# Patient Record
Sex: Female | Born: 1960 | Race: White | Hispanic: No
Health system: Southern US, Community
[De-identification: ages and names within clinical notes are randomized; demographics above are authoritative.]

## PROBLEM LIST (undated history)

## (undated) DIAGNOSIS — Z930 Tracheostomy status: Secondary | ICD-10-CM

## (undated) DIAGNOSIS — J9621 Acute and chronic respiratory failure with hypoxia: Secondary | ICD-10-CM

## (undated) DIAGNOSIS — J1282 Pneumonia due to coronavirus disease 2019: Secondary | ICD-10-CM

## (undated) DIAGNOSIS — J80 Acute respiratory distress syndrome: Secondary | ICD-10-CM

## (undated) DIAGNOSIS — U071 COVID-19: Secondary | ICD-10-CM

---

## 2020-06-15 ENCOUNTER — Institutional Professional Consult (permissible substitution)
Admission: RE | Admit: 2020-06-15 | Discharge: 2020-08-14 | Disposition: A | Discharge status: Rehabilitation Facility | Payer: Medicare (Managed Care) | Attending: Internal Medicine | Admitting: Internal Medicine

## 2020-06-15 ENCOUNTER — Other Ambulatory Visit (HOSPITAL_COMMUNITY): Payer: Medicare (Managed Care)

## 2020-06-15 DIAGNOSIS — R609 Edema, unspecified: Secondary | ICD-10-CM

## 2020-06-15 DIAGNOSIS — D729 Disorder of white blood cells, unspecified: Secondary | ICD-10-CM

## 2020-06-15 DIAGNOSIS — J80 Acute respiratory distress syndrome: Secondary | ICD-10-CM | POA: Diagnosis present

## 2020-06-15 DIAGNOSIS — R509 Fever, unspecified: Secondary | ICD-10-CM

## 2020-06-15 DIAGNOSIS — Z9911 Dependence on respirator [ventilator] status: Secondary | ICD-10-CM

## 2020-06-15 DIAGNOSIS — R0602 Shortness of breath: Secondary | ICD-10-CM

## 2020-06-15 DIAGNOSIS — J1282 Pneumonia due to coronavirus disease 2019: Secondary | ICD-10-CM | POA: Diagnosis present

## 2020-06-15 DIAGNOSIS — U071 COVID-19: Secondary | ICD-10-CM | POA: Diagnosis present

## 2020-06-15 DIAGNOSIS — Z931 Gastrostomy status: Secondary | ICD-10-CM

## 2020-06-15 DIAGNOSIS — J9690 Respiratory failure, unspecified, unspecified whether with hypoxia or hypercapnia: Secondary | ICD-10-CM

## 2020-06-15 DIAGNOSIS — J189 Pneumonia, unspecified organism: Secondary | ICD-10-CM

## 2020-06-15 DIAGNOSIS — Z930 Tracheostomy status: Secondary | ICD-10-CM

## 2020-06-15 DIAGNOSIS — J9621 Acute and chronic respiratory failure with hypoxia: Secondary | ICD-10-CM | POA: Diagnosis present

## 2020-06-15 HISTORY — DX: Pneumonia due to coronavirus disease 2019: J12.82

## 2020-06-15 HISTORY — DX: Acute respiratory distress syndrome: J80

## 2020-06-15 HISTORY — DX: COVID-19: U07.1

## 2020-06-15 HISTORY — DX: Tracheostomy status: Z93.0

## 2020-06-15 HISTORY — DX: Acute and chronic respiratory failure with hypoxia: J96.21

## 2020-06-15 MED ORDER — IOHEXOL 300 MG/ML  SOLN
50.0000 mL | Freq: Once | INTRAMUSCULAR | Status: AC | PRN
  Administered 2020-06-15: 50 mL

## 2020-06-16 ENCOUNTER — Other Ambulatory Visit (HOSPITAL_COMMUNITY): Payer: Medicare (Managed Care)

## 2020-06-16 DIAGNOSIS — J9621 Acute and chronic respiratory failure with hypoxia: Secondary | ICD-10-CM | POA: Diagnosis not present

## 2020-06-16 DIAGNOSIS — Z930 Tracheostomy status: Secondary | ICD-10-CM | POA: Diagnosis not present

## 2020-06-16 DIAGNOSIS — U071 COVID-19: Secondary | ICD-10-CM | POA: Diagnosis not present

## 2020-06-16 DIAGNOSIS — J1282 Pneumonia due to coronavirus disease 2019: Secondary | ICD-10-CM

## 2020-06-16 DIAGNOSIS — Z9911 Dependence on respirator [ventilator] status: Secondary | ICD-10-CM

## 2020-06-16 DIAGNOSIS — J80 Acute respiratory distress syndrome: Secondary | ICD-10-CM

## 2020-06-16 LAB — COMPREHENSIVE METABOLIC PANEL
ALT: 82 U/L — ABNORMAL HIGH (ref 0–44)
AST: 25 U/L (ref 15–41)
Albumin: 2.7 g/dL — ABNORMAL LOW (ref 3.5–5.0)
Alkaline Phosphatase: 147 U/L — ABNORMAL HIGH (ref 38–126)
Anion gap: 12 (ref 5–15)
BUN: 31 mg/dL — ABNORMAL HIGH (ref 8–23)
CO2: 26 mmol/L (ref 22–32)
Calcium: 9.1 mg/dL (ref 8.9–10.3)
Chloride: 107 mmol/L (ref 98–111)
Creatinine, Ser: 0.79 mg/dL (ref 0.44–1.00)
GFR, Estimated: >60 mL/min (ref >60)
Glucose, Bld: 79 mg/dL (ref 70–99)
Potassium: 3.3 mmol/L — ABNORMAL LOW (ref 3.5–5.1)
Sodium: 145 mmol/L (ref 135–145)
Total Bilirubin: 0.5 mg/dL (ref 0.3–1.2)
Total Protein: 6.2 g/dL — ABNORMAL LOW (ref 6.5–8.1)

## 2020-06-16 LAB — URINALYSIS, ROUTINE W REFLEX MICROSCOPIC
Bilirubin Urine: NEGATIVE
Glucose, UA: NEGATIVE mg/dL
Hgb urine dipstick: NEGATIVE
Ketones, ur: NEGATIVE mg/dL
Leukocytes,Ua: NEGATIVE
Nitrite: NEGATIVE
Protein, ur: NEGATIVE mg/dL
Specific Gravity, Urine: 1.016 (ref 1.005–1.030)
pH: 6 (ref 5.0–8.0)

## 2020-06-16 LAB — CBC
HCT: 32.3 % — ABNORMAL LOW (ref 36.0–46.0)
Hemoglobin: 10 g/dL — ABNORMAL LOW (ref 12.0–15.0)
MCH: 33 pg (ref 26.0–34.0)
MCHC: 31 g/dL (ref 30.0–36.0)
MCV: 106.6 fL — ABNORMAL HIGH (ref 80.0–100.0)
Platelets: 322 10*3/uL (ref 150–400)
RBC: 3.03 MIL/uL — ABNORMAL LOW (ref 3.87–5.11)
RDW: 12.8 % (ref 11.5–15.5)
WBC: 8.1 10*3/uL (ref 4.0–10.5)
nRBC: 0 % (ref 0.0–0.2)

## 2020-06-16 LAB — BLOOD GAS, ARTERIAL
Acid-Base Excess: 3.4 mmol/L — ABNORMAL HIGH (ref 0.0–2.0)
Bicarbonate: 27.6 mmol/L (ref 20.0–28.0)
FIO2: 60
O2 Saturation: 97.8 %
Patient temperature: 36.9
pCO2 arterial: 42.7 mmHg (ref 32.0–48.0)
pH, Arterial: 7.426 (ref 7.350–7.450)
pO2, Arterial: 102 mmHg (ref 83.0–108.0)

## 2020-06-16 LAB — PROTIME-INR
INR: 1.1 (ref 0.8–1.2)
Prothrombin Time: 13.3 seconds (ref 11.4–15.2)

## 2020-06-16 NOTE — Consult Note (Signed)
Pulmonary Critical Care Medicine [facility] [place]  PULMONARY SERVICE  Date of Service: 06/16/2020  PULMONARY CRITICAL CARE Hind Chesler  SWH:[id number]  DOB: 28-Dec-1960   DOA: 06/15/2020  Referring Physician: Carron Curie, MD  HPI: Emma Anderson is a 60 y.o. female seen for follow up of Acute on Chronic Respiratory Failure.  Patient has multiple medical problems including coronary artery disease hypertension reflux came into the hospital because of increasing shortness of breath was found to be tachycardic and apparently also had a positive Covid.  Patient had not been vaccinated prior.  Patient was treated initially with BiPAP did not do well and ended up having to be intubated.  Patient also received Decadron and baricitinib.  Patient also did receive treatment for bacterial pneumonia empirically.  CT angio negative for pulmonary embolism.  Her hospital course was complicated by drop in blood pressure and mottling of lower extremities which was felt to be secondary to vasospasm.  Patient underwent tracheostomy for failure to wean.  She did subsequently have necrotic fourth and fifth toes felt to be due to vascular phenomenon subsequently not able to come off the ventilator transferred to our facility for further management and weaning.  Review of Systems:  ROS performed and is unremarkable other than noted above.  Past medical history: GERD Hypertension Coronary artery disease Covid pneumonia  Past surgical history: Tracheostomy PEG  Social history: Unknown tobacco alcohol drug abuse  Family history: Noncontributory to present illness  Allergies: Depakote   Medications: Reviewed on Rounds  Physical Exam:  Vitals: Temperature is 98.0 pulse 83 respiratory 26 blood pressure is 159/82 saturations 98%  Ventilator Settings on pressure assist control FiO2 50% IP 18 PEEP 8  . General: Comfortable at this time . Eyes: Grossly normal lids, irises &  conjunctiva . ENT: grossly tongue is normal . Neck: no obvious mass . Cardiovascular: S1-S2 normal no gallop or rub . Respiratory: No rhonchi and no rales . Abdomen: Soft and nontender . Skin: no rash seen on limited exam . Musculoskeletal: not rigid . Psychiatric:unable to assess . Neurologic: no seizure no involuntary movements         Labs on Admission:  Basic Metabolic Panel: Recent Labs  Lab 06/16/20 0550  NA 145  K 3.3*  CL 107  CO2 26  GLUCOSE 79  BUN 31*  CREATININE 0.79  CALCIUM 9.1    Recent Labs  Lab 06/16/20 0557  PHART 7.426  PCO2ART 42.7  PO2ART 102  HCO3 27.6  O2SAT 97.8    Liver Function Tests: Recent Labs  Lab 06/16/20 0550  AST 25  ALT 82*  ALKPHOS 147*  BILITOT 0.5  PROT 6.2*  ALBUMIN 2.7*   No results for input(s): LIPASE, AMYLASE in the last 168 hours. No results for input(s): AMMONIA in the last 168 hours.  CBC: Recent Labs  Lab 06/16/20 0550  WBC 8.1  HGB 10.0*  HCT 32.3*  MCV 106.6*  PLT 322    Cardiac Enzymes: No results for input(s): CKTOTAL, CKMB, CKMBINDEX, TROPONINI in the last 168 hours.  BNP (last 3 results) No results for input(s): BNP in the last 8760 hours.  ProBNP (last 3 results) No results for input(s): PROBNP in the last 8760 hours.   Radiological Exams on Admission: DG ABDOMEN PEG TUBE LOCATION  Result Date: 06/15/2020 CLINICAL DATA:  Check gastrostomy catheter placement EXAM: ABDOMEN - 1 VIEW COMPARISON:  None. FINDINGS: Gastrostomy catheter is noted within the stomach. Injected contrast flows freely into  the gastric lumen. Nonobstructive bowel gas pattern is noted. No free air is seen. IMPRESSION: Gastrostomy catheter within the stomach. Electronically Signed   By: Alcide Clever M.D.   On: 06/15/2020 21:57    Assessment/Plan Active Problems:   Acute on chronic respiratory failure with hypoxia (HCC)   COVID-19 virus infection   Tracheostomy status (HCC)   Pneumonia due to COVID-19 virus   Acute  respiratory distress syndrome (ARDS) due to COVID-19 virus (HCC)   1. Acute on chronic respiratory failure with hypoxia Reitnauer patient is on full support and pressure assist control mode on 50% FiO2 with an IP of 18 currently the PEEP is 8 plan is going to be to wean the FiO2 with PEEP 2. COVID-19 virus infection recovery plan is to continue with supportive care 3. Tracheostomy status we will be working slowly towards advancement and weaning. 4. COVID-19 pneumonia patient has been also treated empirically with antibiotics. 5. ARDS physiology we will continue with ventilation accordingly  I have personally seen and evaluated the patient, evaluated laboratory and imaging results, formulated the assessment and plan and placed orders. The Patient requires high complexity decision making with multiple systems involvement.  Case was discussed on Rounds with the Respiratory Therapy Director and the Respiratory staff Time Spent  Yevonne Pax, MD [facility] Pulmonary Critical Care Medicine Sleep Medicine

## 2020-06-17 DIAGNOSIS — Z930 Tracheostomy status: Secondary | ICD-10-CM | POA: Diagnosis not present

## 2020-06-17 DIAGNOSIS — U071 COVID-19: Secondary | ICD-10-CM | POA: Diagnosis not present

## 2020-06-17 DIAGNOSIS — Z9911 Dependence on respirator [ventilator] status: Secondary | ICD-10-CM | POA: Diagnosis not present

## 2020-06-17 DIAGNOSIS — J9621 Acute and chronic respiratory failure with hypoxia: Secondary | ICD-10-CM | POA: Diagnosis not present

## 2020-06-17 LAB — URINE CULTURE

## 2020-06-17 LAB — BASIC METABOLIC PANEL
Anion gap: 10 (ref 5–15)
BUN: 25 mg/dL — ABNORMAL HIGH (ref 8–23)
CO2: 28 mmol/L (ref 22–32)
Calcium: 9 mg/dL (ref 8.9–10.3)
Chloride: 100 mmol/L (ref 98–111)
Creatinine, Ser: 0.77 mg/dL (ref 0.44–1.00)
GFR, Estimated: >60 mL/min (ref >60)
Glucose, Bld: 254 mg/dL — ABNORMAL HIGH (ref 70–99)
Potassium: 4.8 mmol/L (ref 3.5–5.1)
Sodium: 138 mmol/L (ref 135–145)

## 2020-06-17 LAB — CBC
HCT: 28.7 % — ABNORMAL LOW (ref 36.0–46.0)
Hemoglobin: 9.3 g/dL — ABNORMAL LOW (ref 12.0–15.0)
MCH: 34.1 pg — ABNORMAL HIGH (ref 26.0–34.0)
MCHC: 32.4 g/dL (ref 30.0–36.0)
MCV: 105.1 fL — ABNORMAL HIGH (ref 80.0–100.0)
Platelets: 281 10*3/uL (ref 150–400)
RBC: 2.73 MIL/uL — ABNORMAL LOW (ref 3.87–5.11)
RDW: 12.6 % (ref 11.5–15.5)
WBC: 7.2 10*3/uL (ref 4.0–10.5)
nRBC: 0 % (ref 0.0–0.2)

## 2020-06-17 LAB — HEMOGLOBIN A1C
Hgb A1c MFr Bld: 7.3 % — ABNORMAL HIGH (ref 4.8–5.6)
Mean Plasma Glucose: 162.81 mg/dL

## 2020-06-17 LAB — MAGNESIUM: Magnesium: 2 mg/dL (ref 1.7–2.4)

## 2020-06-17 LAB — TSH: TSH: 1.208 u[IU]/mL (ref 0.350–4.500)

## 2020-06-17 LAB — PHOSPHORUS: Phosphorus: 3.7 mg/dL (ref 2.5–4.6)

## 2020-06-17 LAB — T4, FREE: Free T4: 0.77 ng/dL (ref 0.61–1.12)

## 2020-06-17 NOTE — Progress Notes (Signed)
Pulmonary Critical Care Medicine [facility] [place]   PULMONARY CRITICAL CARE SERVICE  PROGRESS NOTE  Date of Service: 06/17/2020  Taressa Rauh  XFG:[id number]  DOB: 1961-03-12   DOA: 06/15/2020  Referring Physician: Carron Curie, MD  HPI: Jayde Mcallister is a 60 y.o. female seen for follow up of Acute on Chronic Respiratory Failure.  Patient currently is on pressure support has been on 40% FiO2 with a pressure of 12/5  Medications: Reviewed on Rounds  Physical Exam:  Vitals: Temperature is 96.7 pulse 79 respiratory rate 23 blood pressure is 122/73 saturations 93%  Ventilator Settings on pressure support FiO2 40% pressure of 12/5  . General: Comfortable at this time . Eyes: Grossly normal lids, irises & conjunctiva . ENT: grossly tongue is normal . Neck: no obvious mass . Cardiovascular: S1 S2 normal no gallop . Respiratory: No rhonchi very coarse breath sounds . Abdomen: soft . Skin: no rash seen on limited exam . Musculoskeletal: not rigid . Psychiatric:unable to assess . Neurologic: no seizure no involuntary movements         Lab Data:   Basic Metabolic Panel: Recent Labs  Lab 06/16/20 0550 06/17/20 0428  NA 145 138  K 3.3* 4.8  CL 107 100  CO2 26 28  GLUCOSE 79 254*  BUN 31* 25*  CREATININE 0.79 0.77  CALCIUM 9.1 9.0  MG  --  2.0  PHOS  --  3.7    ABG: Recent Labs  Lab 06/16/20 0557  PHART 7.426  PCO2ART 42.7  PO2ART 102  HCO3 27.6  O2SAT 97.8    Liver Function Tests: Recent Labs  Lab 06/16/20 0550  AST 25  ALT 82*  ALKPHOS 147*  BILITOT 0.5  PROT 6.2*  ALBUMIN 2.7*   No results for input(s): LIPASE, AMYLASE in the last 168 hours. No results for input(s): AMMONIA in the last 168 hours.  CBC: Recent Labs  Lab 06/16/20 0550 06/17/20 0428  WBC 8.1 7.2  HGB 10.0* 9.3*  HCT 32.3* 28.7*  MCV 106.6* 105.1*  PLT 322 281    Cardiac Enzymes: No results for input(s): CKTOTAL, CKMB, CKMBINDEX, TROPONINI in the last 168  hours.  BNP (last 3 results) No results for input(s): BNP in the last 8760 hours.  ProBNP (last 3 results) No results for input(s): PROBNP in the last 8760 hours.  Radiological Exams: DG ABDOMEN PEG TUBE LOCATION  Result Date: 06/15/2020 CLINICAL DATA:  Check gastrostomy catheter placement EXAM: ABDOMEN - 1 VIEW COMPARISON:  None. FINDINGS: Gastrostomy catheter is noted within the stomach. Injected contrast flows freely into the gastric lumen. Nonobstructive bowel gas pattern is noted. No free air is seen. IMPRESSION: Gastrostomy catheter within the stomach. Electronically Signed   By: Alcide Clever M.D.   On: 06/15/2020 21:57   DG CHEST PORT 1 VIEW  Result Date: 06/16/2020 CLINICAL DATA:  Respiratory failure. EXAM: PORTABLE CHEST 1 VIEW COMPARISON:  None. FINDINGS: Monitoring leads overlie the patient. Patient is rotated to the left. Suspect tracheostomy tube overlying the thoracic inlet. Cardiac contours upper limits of normal. Diffuse bilateral airspace opacities, left greater than right. Possible small left pleural effusion. No pneumothorax. IMPRESSION: Diffuse bilateral airspace opacities, left greater than right, may represent multifocal infection in the appropriate clinical setting, atypical infection and/or edema. As no priors are available for comparison, a portion may represent chronic underlying process. Electronically Signed   By: Annia Belt M.D.   On: 06/16/2020 11:45    Assessment/Plan Active Problems:   Acute on  chronic respiratory failure with hypoxia (HCC)   COVID-19 virus infection   Tracheostomy status (HCC)   Pneumonia due to COVID-19 virus   Acute respiratory distress syndrome (ARDS) due to COVID-19 virus (HCC)   1. Acute on chronic respiratory failure with hypoxia patient is weaning on protocol today's goal will be up to 4 hours.  We will continue to advance as tolerated 2. COVID-19 virus infection recovery phase we will continue to follow 3. Tracheostomy remains in  place at this time 4. Pneumonia due to COVID-19 slow improvement 5. ARDS physiology we will continue with supportive care and mechanical ventilation accordingly   I have personally seen and evaluated the patient, evaluated laboratory and imaging results, formulated the assessment and plan and placed orders. The Patient requires high complexity decision making with multiple systems involvement.  Rounds were done with the Respiratory Therapy Director and Staff therapists and discussed with nursing staff also.  Yevonne Pax, MD [facility] Pulmonary Critical Care Medicine Sleep Medicine

## 2020-06-18 DIAGNOSIS — J9621 Acute and chronic respiratory failure with hypoxia: Secondary | ICD-10-CM | POA: Diagnosis not present

## 2020-06-18 DIAGNOSIS — Z930 Tracheostomy status: Secondary | ICD-10-CM | POA: Diagnosis not present

## 2020-06-18 DIAGNOSIS — Z9911 Dependence on respirator [ventilator] status: Secondary | ICD-10-CM | POA: Diagnosis not present

## 2020-06-18 DIAGNOSIS — U071 COVID-19: Secondary | ICD-10-CM | POA: Diagnosis not present

## 2020-06-18 LAB — CULTURE, RESPIRATORY W GRAM STAIN

## 2020-06-18 LAB — CBC
HCT: 26.8 % — ABNORMAL LOW (ref 36.0–46.0)
Hemoglobin: 8.6 g/dL — ABNORMAL LOW (ref 12.0–15.0)
MCH: 33.6 pg (ref 26.0–34.0)
MCHC: 32.1 g/dL (ref 30.0–36.0)
MCV: 104.7 fL — ABNORMAL HIGH (ref 80.0–100.0)
Platelets: 262 10*3/uL (ref 150–400)
RBC: 2.56 MIL/uL — ABNORMAL LOW (ref 3.87–5.11)
RDW: 12.4 % (ref 11.5–15.5)
WBC: 6.5 10*3/uL (ref 4.0–10.5)
nRBC: 0 % (ref 0.0–0.2)

## 2020-06-18 LAB — BASIC METABOLIC PANEL
Anion gap: 12 (ref 5–15)
BUN: 29 mg/dL — ABNORMAL HIGH (ref 8–23)
CO2: 29 mmol/L (ref 22–32)
Calcium: 9.1 mg/dL (ref 8.9–10.3)
Chloride: 96 mmol/L — ABNORMAL LOW (ref 98–111)
Creatinine, Ser: 0.81 mg/dL (ref 0.44–1.00)
GFR, Estimated: >60 mL/min (ref >60)
Glucose, Bld: 229 mg/dL — ABNORMAL HIGH (ref 70–99)
Potassium: 4.5 mmol/L (ref 3.5–5.1)
Sodium: 137 mmol/L (ref 135–145)

## 2020-06-18 LAB — MAGNESIUM: Magnesium: 1.9 mg/dL (ref 1.7–2.4)

## 2020-06-18 NOTE — Progress Notes (Signed)
Pulmonary Critical Care Medicine [facility] [place]   PULMONARY CRITICAL CARE SERVICE  PROGRESS NOTE  Date of Service: 06/18/2020  Shaneta Cervenka  HEN:[id number]  DOB: [birth year]   DOA: 06/15/2020  Referring Physician: Carron Curie, MD  HPI: Emma Anderson is a 60 y.o. female seen for follow up of Acute on Chronic Respiratory Failure.  Patient currently is on pressure support has been on 45% FiO2 the goal is for 4-hour wean on pressure support 12/5  Medications: Reviewed on Rounds  Physical Exam:  Vitals: Temperature is 96.6 pulse 70 respiratory rate 20 blood pressure is 107/70 saturations 96%  Ventilator Settings on pressure support FiO2 45% pressure of 12/5  . General: Comfortable at this time . Eyes: Grossly normal lids, irises & conjunctiva . ENT: grossly tongue is normal . Neck: no obvious mass . Cardiovascular: S1 S2 normal no gallop . Respiratory: Scattered rhonchi very coarse breath sounds . Abdomen: soft . Skin: no rash seen on limited exam . Musculoskeletal: not rigid . Psychiatric:unable to assess . Neurologic: no seizure no involuntary movements         Lab Data:   Basic Metabolic Panel: Recent Labs  Lab 06/16/20 0550 06/17/20 0428  NA 145 138  K 3.3* 4.8  CL 107 100  CO2 26 28  GLUCOSE 79 254*  BUN 31* 25*  CREATININE 0.79 0.77  CALCIUM 9.1 9.0  MG  --  2.0  PHOS  --  3.7    ABG: Recent Labs  Lab 06/16/20 0557  PHART 7.426  PCO2ART 42.7  PO2ART 102  HCO3 27.6  O2SAT 97.8    Liver Function Tests: Recent Labs  Lab 06/16/20 0550  AST 25  ALT 82*  ALKPHOS 147*  BILITOT 0.5  PROT 6.2*  ALBUMIN 2.7*   No results for input(s): LIPASE, AMYLASE in the last 168 hours. No results for input(s): AMMONIA in the last 168 hours.  CBC: Recent Labs  Lab 06/16/20 0550 06/17/20 0428  WBC 8.1 7.2  HGB 10.0* 9.3*  HCT 32.3* 28.7*  MCV 106.6* 105.1*  PLT 322 281    Cardiac Enzymes: No results for input(s): CKTOTAL, CKMB,  CKMBINDEX, TROPONINI in the last 168 hours.  BNP (last 3 results) No results for input(s): BNP in the last 8760 hours.  ProBNP (last 3 results) No results for input(s): PROBNP in the last 8760 hours.  Radiological Exams: DG CHEST PORT 1 VIEW  Result Date: 06/16/2020 CLINICAL DATA:  Respiratory failure. EXAM: PORTABLE CHEST 1 VIEW COMPARISON:  None. FINDINGS: Monitoring leads overlie the patient. Patient is rotated to the left. Suspect tracheostomy tube overlying the thoracic inlet. Cardiac contours upper limits of normal. Diffuse bilateral airspace opacities, left greater than right. Possible small left pleural effusion. No pneumothorax. IMPRESSION: Diffuse bilateral airspace opacities, left greater than right, may represent multifocal infection in the appropriate clinical setting, atypical infection and/or edema. As no priors are available for comparison, a portion may represent chronic underlying process. Electronically Signed   By: Annia Belt M.D.   On: 06/16/2020 11:45    Assessment/Plan Active Problems:   Acute on chronic respiratory failure with hypoxia (HCC)   COVID-19 virus infection   Tracheostomy status (HCC)   Pneumonia due to COVID-19 virus   Acute respiratory distress syndrome (ARDS) due to COVID-19 virus (HCC)   1. Acute on chronic respiratory failure hypoxia we will continue to wean on protocol goal is for 4 hours of pressure support weaning today. 2. COVID-19 virus infection in recovery we  will continue with supportive care. 3. Tracheostomy remains in place 4. Pneumonia due to COVID-19 slow improvement 5. ARDS continue to follow up on chest films   I have personally seen and evaluated the patient, evaluated laboratory and imaging results, formulated the assessment and plan and placed orders. The Patient requires high complexity decision making with multiple systems involvement.  Rounds were done with the Respiratory Therapy Director and Staff therapists and discussed  with nursing staff also.  Yevonne Pax, MD [facility] Pulmonary Critical Care Medicine Sleep Medicine

## 2020-06-19 DIAGNOSIS — Z930 Tracheostomy status: Secondary | ICD-10-CM | POA: Diagnosis not present

## 2020-06-19 DIAGNOSIS — J9621 Acute and chronic respiratory failure with hypoxia: Secondary | ICD-10-CM | POA: Diagnosis not present

## 2020-06-19 DIAGNOSIS — J1282 Pneumonia due to coronavirus disease 2019: Secondary | ICD-10-CM | POA: Diagnosis not present

## 2020-06-19 DIAGNOSIS — U071 COVID-19: Secondary | ICD-10-CM | POA: Diagnosis not present

## 2020-06-19 DIAGNOSIS — J80 Acute respiratory distress syndrome: Secondary | ICD-10-CM | POA: Diagnosis not present

## 2020-06-19 NOTE — Progress Notes (Signed)
Pulmonary Critical Care Medicine [facility] [place]   PULMONARY CRITICAL CARE SERVICE  PROGRESS NOTE  Date of Service: 06/19/2020  Emma Anderson  KXF:[id number]  DOB: [birth year]   DOA: 06/15/2020  Referring Physician: Carron Curie, MD  HPI: Emma Anderson is a 60 y.o. female seen for follow up of Acute on Chronic Respiratory Failure.  Patient currently is on pressure support has been on 40% FiO2 with a pressure of 12/5  Medications: Reviewed on Rounds  Physical Exam:  Vitals: Temperature is 98.1 pulse 76 respiratory 22 blood pressure is 118/75 saturations 98%  Ventilator Settings on pressure support FiO2 40% pressure 12/5  . General: Comfortable at this time . Eyes: Grossly normal lids, irises & conjunctiva . ENT: grossly tongue is normal . Neck: no obvious mass . Cardiovascular: S1 S2 normal no gallop . Respiratory: Scattered rhonchi expansion is equal . Abdomen: soft . Skin: no rash seen on limited exam . Musculoskeletal: not rigid . Psychiatric:unable to assess . Neurologic: no seizure no involuntary movements         Lab Data:   Basic Metabolic Panel: Recent Labs  Lab 06/16/20 0550 06/17/20 0428 06/18/20 0908  NA 145 138 137  K 3.3* 4.8 4.5  CL 107 100 96*  CO2 26 28 29   GLUCOSE 79 254* 229*  BUN 31* 25* 29*  CREATININE 0.79 0.77 0.81  CALCIUM 9.1 9.0 9.1  MG  --  2.0 1.9  PHOS  --  3.7  --     ABG: Recent Labs  Lab 06/16/20 0557  PHART 7.426  PCO2ART 42.7  PO2ART 102  HCO3 27.6  O2SAT 97.8    Liver Function Tests: Recent Labs  Lab 06/16/20 0550  AST 25  ALT 82*  ALKPHOS 147*  BILITOT 0.5  PROT 6.2*  ALBUMIN 2.7*   No results for input(s): LIPASE, AMYLASE in the last 168 hours. No results for input(s): AMMONIA in the last 168 hours.  CBC: Recent Labs  Lab 06/16/20 0550 06/17/20 0428 06/18/20 0908  WBC 8.1 7.2 6.5  HGB 10.0* 9.3* 8.6*  HCT 32.3* 28.7* 26.8*  MCV 106.6* 105.1* 104.7*  PLT 322 281 262     Cardiac Enzymes: No results for input(s): CKTOTAL, CKMB, CKMBINDEX, TROPONINI in the last 168 hours.  BNP (last 3 results) No results for input(s): BNP in the last 8760 hours.  ProBNP (last 3 results) No results for input(s): PROBNP in the last 8760 hours.  Radiological Exams: No results found.  Assessment/Plan Active Problems:   Acute on chronic respiratory failure with hypoxia (HCC)   COVID-19 virus infection   Tracheostomy status (HCC)   Pneumonia due to COVID-19 virus   Acute respiratory distress syndrome (ARDS) due to COVID-19 virus (HCC)   1. Acute on chronic respiratory failure hypoxia plan is to continue with the pressure support 12-hour goal today 2. COVID-19 virus infection recovery 3. Pneumonia due to COVID-19 slow improvement 4. Tracheostomy working towards eventual capping and changing out 5. ARDS physiology we will continue to follow along slow improvement has been noted   I have personally seen and evaluated the patient, evaluated laboratory and imaging results, formulated the assessment and plan and placed orders. The Patient requires high complexity decision making with multiple systems involvement.  Rounds were done with the Respiratory Therapy Director and Staff therapists and discussed with nursing staff also.  06/20/20, MD [facility] Pulmonary Critical Care Medicine Sleep Medicine

## 2020-06-20 ENCOUNTER — Other Ambulatory Visit (HOSPITAL_COMMUNITY): Payer: Medicare (Managed Care)

## 2020-06-20 LAB — URINALYSIS, ROUTINE W REFLEX MICROSCOPIC
Bilirubin Urine: NEGATIVE
Glucose, UA: NEGATIVE mg/dL
Hgb urine dipstick: NEGATIVE
Ketones, ur: NEGATIVE mg/dL
Nitrite: NEGATIVE
Protein, ur: NEGATIVE mg/dL
Specific Gravity, Urine: 1.01 (ref 1.005–1.030)
WBC, UA: >50 WBC/hpf — ABNORMAL HIGH (ref 0–5)
pH: 8 (ref 5.0–8.0)

## 2020-06-21 DIAGNOSIS — J9621 Acute and chronic respiratory failure with hypoxia: Secondary | ICD-10-CM | POA: Diagnosis not present

## 2020-06-21 DIAGNOSIS — J80 Acute respiratory distress syndrome: Secondary | ICD-10-CM | POA: Diagnosis not present

## 2020-06-21 DIAGNOSIS — J1282 Pneumonia due to coronavirus disease 2019: Secondary | ICD-10-CM | POA: Diagnosis not present

## 2020-06-21 DIAGNOSIS — Z930 Tracheostomy status: Secondary | ICD-10-CM | POA: Diagnosis not present

## 2020-06-21 DIAGNOSIS — U071 COVID-19: Secondary | ICD-10-CM | POA: Diagnosis not present

## 2020-06-21 LAB — CBC
HCT: 34.9 % — ABNORMAL LOW (ref 36.0–46.0)
Hemoglobin: 11.1 g/dL — ABNORMAL LOW (ref 12.0–15.0)
MCH: 33.5 pg (ref 26.0–34.0)
MCHC: 31.8 g/dL (ref 30.0–36.0)
MCV: 105.4 fL — ABNORMAL HIGH (ref 80.0–100.0)
Platelets: 288 10*3/uL (ref 150–400)
RBC: 3.31 MIL/uL — ABNORMAL LOW (ref 3.87–5.11)
RDW: 12.7 % (ref 11.5–15.5)
WBC: 8 10*3/uL (ref 4.0–10.5)
nRBC: 0 % (ref 0.0–0.2)

## 2020-06-21 LAB — RENAL FUNCTION PANEL
Albumin: 2.8 g/dL — ABNORMAL LOW (ref 3.5–5.0)
Anion gap: 13 (ref 5–15)
BUN: 42 mg/dL — ABNORMAL HIGH (ref 8–23)
CO2: 28 mmol/L (ref 22–32)
Calcium: 10 mg/dL (ref 8.9–10.3)
Chloride: 95 mmol/L — ABNORMAL LOW (ref 98–111)
Creatinine, Ser: 0.94 mg/dL (ref 0.44–1.00)
GFR, Estimated: >60 mL/min (ref >60)
Glucose, Bld: 147 mg/dL — ABNORMAL HIGH (ref 70–99)
Phosphorus: 5.3 mg/dL — ABNORMAL HIGH (ref 2.5–4.6)
Potassium: 5 mmol/L (ref 3.5–5.1)
Sodium: 136 mmol/L (ref 135–145)

## 2020-06-21 NOTE — Progress Notes (Signed)
Pulmonary Critical Care Medicine [facility] [place]   PULMONARY CRITICAL CARE SERVICE  PROGRESS NOTE  Date of Service: 06/21/2020  Aalyssa Montufar  ZOX:[id number]  DOB: December 02, 1960   DOA: 06/15/2020  Referring Physician: Carron Curie, MD  HPI: Emma Anderson is a 60 y.o. female seen for follow up of Acute on Chronic Respiratory Failure.  Patient is weaning on T collar today for goal of about 2 hours seems to be doing okay  Medications: Reviewed on Rounds  Physical Exam:  Vitals: Temperature 96.4 pulse 67 respiratory 18 blood pressure is 90/54 saturations 100%  Ventilator Settings on T collar off the vent  . General: Comfortable at this time . Eyes: Grossly normal lids, irises & conjunctiva . ENT: grossly tongue is normal . Neck: no obvious mass . Cardiovascular: S1 S2 normal no gallop . Respiratory: Scattered rhonchi expansion is equal . Abdomen: soft . Skin: no rash seen on limited exam . Musculoskeletal: not rigid . Psychiatric:unable to assess . Neurologic: no seizure no involuntary movements         Lab Data:   Basic Metabolic Panel: Recent Labs  Lab 06/16/20 0550 06/17/20 0428 06/18/20 0908 06/21/20 0505  NA 145 138 137 136  K 3.3* 4.8 4.5 5.0  CL 107 100 96* 95*  CO2 26 28 29 28   GLUCOSE 79 254* 229* 147*  BUN 31* 25* 29* 42*  CREATININE 0.79 0.77 0.81 0.94  CALCIUM 9.1 9.0 9.1 10.0  MG  --  2.0 1.9  --   PHOS  --  3.7  --  5.3*    ABG: Recent Labs  Lab 06/16/20 0557  PHART 7.426  PCO2ART 42.7  PO2ART 102  HCO3 27.6  O2SAT 97.8    Liver Function Tests: Recent Labs  Lab 06/16/20 0550 06/21/20 0505  AST 25  --   ALT 82*  --   ALKPHOS 147*  --   BILITOT 0.5  --   PROT 6.2*  --   ALBUMIN 2.7* 2.8*   No results for input(s): LIPASE, AMYLASE in the last 168 hours. No results for input(s): AMMONIA in the last 168 hours.  CBC: Recent Labs  Lab 06/16/20 0550 06/17/20 0428 06/18/20 0908 06/21/20 0816  WBC 8.1 7.2 6.5 8.0   HGB 10.0* 9.3* 8.6* 11.1*  HCT 32.3* 28.7* 26.8* 34.9*  MCV 106.6* 105.1* 104.7* 105.4*  PLT 322 281 262 288    Cardiac Enzymes: No results for input(s): CKTOTAL, CKMB, CKMBINDEX, TROPONINI in the last 168 hours.  BNP (last 3 results) No results for input(s): BNP in the last 8760 hours.  ProBNP (last 3 results) No results for input(s): PROBNP in the last 8760 hours.  Radiological Exams: DG CHEST PORT 1 VIEW  Result Date: 06/20/2020 CLINICAL DATA:  Fever. EXAM: PORTABLE CHEST 1 VIEW COMPARISON:  06/16/2020. FINDINGS: Patient rotated to the left. Tracheostomy tube in stable position. Heart size stable. Low lung volumes. Diffuse bilateral pulmonary infiltrates/edema again noted. Slight improvement in aeration of the left lung noted on today's exam. Possible small left pleural effusion again noted. No pneumothorax. IMPRESSION: Tracheostomy tube in stable position. Low lung volumes. Diffuse bilateral pulmonary infiltrates/edema again noted. Slight improvement in aeration of the left lung noted on today's exam. Possible small left pleural effusion again noted. Electronically Signed   By: Maisie Fus  Register   On: 06/20/2020 15:14    Assessment/Plan Active Problems:   Acute on chronic respiratory failure with hypoxia (HCC)   COVID-19 virus infection   Tracheostomy status (HCC)  Pneumonia due to COVID-19 virus   Acute respiratory distress syndrome (ARDS) due to COVID-19 virus (HCC)   1. Acute on chronic respiratory failure hypoxia doing fairly well the plan is going to be to advance the wean on the T collar as tolerated. 2. Tracheostomy remains in place we will continue to advance weaning 3. COVID-19 virus infection recovery 4. Pneumonia due to COVID-19 treated slow improvement 5. ARDS at baseline with slow improvement chest x-ray still showing some diffuse infiltrates   I have personally seen and evaluated the patient, evaluated laboratory and imaging results, formulated the assessment  and plan and placed orders. The Patient requires high complexity decision making with multiple systems involvement.  Rounds were done with the Respiratory Therapy Director and Staff therapists and discussed with nursing staff also.  Yevonne Pax, MD [facility] Pulmonary Critical Care Medicine Sleep Medicine

## 2020-06-22 ENCOUNTER — Encounter: Payer: Self-pay | Admitting: Internal Medicine

## 2020-06-22 DIAGNOSIS — J1282 Pneumonia due to coronavirus disease 2019: Secondary | ICD-10-CM | POA: Diagnosis not present

## 2020-06-22 DIAGNOSIS — U071 COVID-19: Secondary | ICD-10-CM | POA: Diagnosis present

## 2020-06-22 DIAGNOSIS — Z930 Tracheostomy status: Secondary | ICD-10-CM | POA: Diagnosis not present

## 2020-06-22 DIAGNOSIS — J9621 Acute and chronic respiratory failure with hypoxia: Secondary | ICD-10-CM | POA: Diagnosis present

## 2020-06-22 DIAGNOSIS — J80 Acute respiratory distress syndrome: Secondary | ICD-10-CM | POA: Diagnosis not present

## 2020-06-22 LAB — URINE CULTURE: Culture: >=100000 — AB

## 2020-06-22 LAB — CULTURE, RESPIRATORY W GRAM STAIN

## 2020-06-22 NOTE — Progress Notes (Signed)
Pulmonary Critical Care Medicine [facility] [place]   PULMONARY CRITICAL CARE SERVICE  PROGRESS NOTE  Date of Service: 06/22/2020  Charles Niese  ZOX:[id number]  DOB: Oct 04, 1960   DOA: 06/15/2020  Referring Physician: Carron Curie, MD  HPI: Emma Anderson is a 60 y.o. female seen for follow up of Acute on Chronic Respiratory Failure.  Patient currently is on pressure support has been on 40% FiO2 will try to wean on 12/5  Medications: Reviewed on Rounds  Physical Exam:  Vitals: Temperature is 98.8 pulse 72 respiratory rate is 21 blood pressure is 115/66 saturations 100%  Ventilator Settings on pressure support FiO2 40% pressure 12/5  . General: Comfortable at this time . Eyes: Grossly normal lids, irises & conjunctiva . ENT: grossly tongue is normal . Neck: no obvious mass . Cardiovascular: S1 S2 normal no gallop . Respiratory: Scattered rhonchi expansion is equal . Abdomen: soft . Skin: no rash seen on limited exam . Musculoskeletal: not rigid . Psychiatric:unable to assess . Neurologic: no seizure no involuntary movements         Lab Data:   Basic Metabolic Panel: Recent Labs  Lab 06/16/20 0550 06/17/20 0428 06/18/20 0908 06/21/20 0505  NA 145 138 137 136  K 3.3* 4.8 4.5 5.0  CL 107 100 96* 95*  CO2 26 28 29 28   GLUCOSE 79 254* 229* 147*  BUN 31* 25* 29* 42*  CREATININE 0.79 0.77 0.81 0.94  CALCIUM 9.1 9.0 9.1 10.0  MG  --  2.0 1.9  --   PHOS  --  3.7  --  5.3*    ABG: Recent Labs  Lab 06/16/20 0557  PHART 7.426  PCO2ART 42.7  PO2ART 102  HCO3 27.6  O2SAT 97.8    Liver Function Tests: Recent Labs  Lab 06/16/20 0550 06/21/20 0505  AST 25  --   ALT 82*  --   ALKPHOS 147*  --   BILITOT 0.5  --   PROT 6.2*  --   ALBUMIN 2.7* 2.8*   No results for input(s): LIPASE, AMYLASE in the last 168 hours. No results for input(s): AMMONIA in the last 168 hours.  CBC: Recent Labs  Lab 06/16/20 0550 06/17/20 0428 06/18/20 0908  06/21/20 0816  WBC 8.1 7.2 6.5 8.0  HGB 10.0* 9.3* 8.6* 11.1*  HCT 32.3* 28.7* 26.8* 34.9*  MCV 106.6* 105.1* 104.7* 105.4*  PLT 322 281 262 288    Cardiac Enzymes: No results for input(s): CKTOTAL, CKMB, CKMBINDEX, TROPONINI in the last 168 hours.  BNP (last 3 results) No results for input(s): BNP in the last 8760 hours.  ProBNP (last 3 results) No results for input(s): PROBNP in the last 8760 hours.  Radiological Exams: DG CHEST PORT 1 VIEW  Result Date: 06/20/2020 CLINICAL DATA:  Fever. EXAM: PORTABLE CHEST 1 VIEW COMPARISON:  06/16/2020. FINDINGS: Patient rotated to the left. Tracheostomy tube in stable position. Heart size stable. Low lung volumes. Diffuse bilateral pulmonary infiltrates/edema again noted. Slight improvement in aeration of the left lung noted on today's exam. Possible small left pleural effusion again noted. No pneumothorax. IMPRESSION: Tracheostomy tube in stable position. Low lung volumes. Diffuse bilateral pulmonary infiltrates/edema again noted. Slight improvement in aeration of the left lung noted on today's exam. Possible small left pleural effusion again noted. Electronically Signed   By: 06/18/2020  Register   On: 06/20/2020 15:14    Assessment/Plan Active Problems:   Acute on chronic respiratory failure with hypoxia (HCC)   COVID-19 virus infection  Tracheostomy status (HCC)   Pneumonia due to COVID-19 virus   Acute respiratory distress syndrome (ARDS) due to COVID-19 virus (HCC)   1. Acute on chronic respiratory failure with hypoxia we will continue with on pressure support titrate as tolerated. 2. COVID-19 virus infection recovery 3. Tracheostomy remains in place 4. Pneumonia due to COVID-19 improving 5. ARDS slow improvement we will continue to monitor   I have personally seen and evaluated the patient, evaluated laboratory and imaging results, formulated the assessment and plan and placed orders. The Patient requires high complexity decision  making with multiple systems involvement.  Rounds were done with the Respiratory Therapy Director and Staff therapists and discussed with nursing staff also.  Yevonne Pax, MD [facility] Pulmonary Critical Care Medicine Sleep Medicine

## 2020-06-23 DIAGNOSIS — J80 Acute respiratory distress syndrome: Secondary | ICD-10-CM | POA: Diagnosis not present

## 2020-06-23 DIAGNOSIS — J9621 Acute and chronic respiratory failure with hypoxia: Secondary | ICD-10-CM | POA: Diagnosis not present

## 2020-06-23 DIAGNOSIS — U071 COVID-19: Secondary | ICD-10-CM | POA: Diagnosis not present

## 2020-06-23 DIAGNOSIS — J1282 Pneumonia due to coronavirus disease 2019: Secondary | ICD-10-CM | POA: Diagnosis not present

## 2020-06-23 DIAGNOSIS — Z930 Tracheostomy status: Secondary | ICD-10-CM | POA: Diagnosis not present

## 2020-06-23 LAB — BASIC METABOLIC PANEL
Anion gap: 12 (ref 5–15)
BUN: 44 mg/dL — ABNORMAL HIGH (ref 8–23)
CO2: 28 mmol/L (ref 22–32)
Calcium: 10.1 mg/dL (ref 8.9–10.3)
Chloride: 95 mmol/L — ABNORMAL LOW (ref 98–111)
Creatinine, Ser: 0.84 mg/dL (ref 0.44–1.00)
GFR, Estimated: >60 mL/min (ref >60)
Glucose, Bld: 166 mg/dL — ABNORMAL HIGH (ref 70–99)
Potassium: 4.4 mmol/L (ref 3.5–5.1)
Sodium: 135 mmol/L (ref 135–145)

## 2020-06-23 NOTE — Progress Notes (Signed)
Pulmonary Critical Care Medicine [facility] [place]   PULMONARY CRITICAL CARE SERVICE  PROGRESS NOTE  Date of Service: 06/23/2020  Emma Anderson  YKZ:[id number]  DOB: 03-30-1961   DOA: 06/15/2020  Referring Physician: Carron Curie, MD  HPI: Emma Anderson is a 60 y.o. female seen for follow up of Acute on Chronic Respiratory Failure.  At this time patient is on pressure support has been on 35% FiO2 currently pressure of 12/5  Medications: Reviewed on Rounds  Physical Exam:  Vitals: Temperature is 97.5 pulse 68 respiratory rate 18 blood pressure is 105/62 saturations 99%  Ventilator Settings on pressure support FiO2 35% pressure 12/5  . General: Comfortable at this time . Eyes: Grossly normal lids, irises & conjunctiva . ENT: grossly tongue is normal . Neck: no obvious mass . Cardiovascular: S1 S2 normal no gallop . Respiratory: No rhonchi very coarse breath sounds . Abdomen: soft . Skin: no rash seen on limited exam . Musculoskeletal: not rigid . Psychiatric:unable to assess . Neurologic: no seizure no involuntary movements         Lab Data:   Basic Metabolic Panel: Recent Labs  Lab 06/17/20 0428 06/18/20 0908 06/21/20 0505 06/23/20 0620  NA 138 137 136 135  K 4.8 4.5 5.0 4.4  CL 100 96* 95* 95*  CO2 28 29 28 28   GLUCOSE 254* 229* 147* 166*  BUN 25* 29* 42* 44*  CREATININE 0.77 0.81 0.94 0.84  CALCIUM 9.0 9.1 10.0 10.1  MG 2.0 1.9  --   --   PHOS 3.7  --  5.3*  --     ABG: No results for input(s): PHART, PCO2ART, PO2ART, HCO3, O2SAT in the last 168 hours.  Liver Function Tests: Recent Labs  Lab 06/21/20 0505  ALBUMIN 2.8*   No results for input(s): LIPASE, AMYLASE in the last 168 hours. No results for input(s): AMMONIA in the last 168 hours.  CBC: Recent Labs  Lab 06/17/20 0428 06/18/20 0908 06/21/20 0816  WBC 7.2 6.5 8.0  HGB 9.3* 8.6* 11.1*  HCT 28.7* 26.8* 34.9*  MCV 105.1* 104.7* 105.4*  PLT 281 262 288    Cardiac  Enzymes: No results for input(s): CKTOTAL, CKMB, CKMBINDEX, TROPONINI in the last 168 hours.  BNP (last 3 results) No results for input(s): BNP in the last 8760 hours.  ProBNP (last 3 results) No results for input(s): PROBNP in the last 8760 hours.  Radiological Exams: No results found.  Assessment/Plan Active Problems:   Acute on chronic respiratory failure with hypoxia (HCC)   COVID-19 virus infection   Tracheostomy status (HCC)   Pneumonia due to COVID-19 virus   Acute respiratory distress syndrome (ARDS) due to COVID-19 virus (HCC)   1. Acute on chronic respiratory failure hypoxia we will continue with pressure support wean as tolerated continue pulmonary toilet. 2. COVID-19 virus infection in recovery phase 3. Tracheostomy remains in place we will continue to follow 4. Pneumonia due to COVID-19 at baseline 5. ARDS treated slowly improving   I have personally seen and evaluated the patient, evaluated laboratory and imaging results, formulated the assessment and plan and placed orders. The Patient requires high complexity decision making with multiple systems involvement.  Rounds were done with the Respiratory Therapy Director and Staff therapists and discussed with nursing staff also.  06/23/20, MD [facility] Pulmonary Critical Care Medicine Sleep Medicine

## 2020-06-24 DIAGNOSIS — U071 COVID-19: Secondary | ICD-10-CM | POA: Diagnosis not present

## 2020-06-24 DIAGNOSIS — J80 Acute respiratory distress syndrome: Secondary | ICD-10-CM | POA: Diagnosis not present

## 2020-06-24 DIAGNOSIS — J1282 Pneumonia due to coronavirus disease 2019: Secondary | ICD-10-CM | POA: Diagnosis not present

## 2020-06-24 DIAGNOSIS — Z930 Tracheostomy status: Secondary | ICD-10-CM | POA: Diagnosis not present

## 2020-06-24 DIAGNOSIS — J9621 Acute and chronic respiratory failure with hypoxia: Secondary | ICD-10-CM | POA: Diagnosis not present

## 2020-06-24 NOTE — Progress Notes (Signed)
Pulmonary Critical Care Medicine [facility] [place]   PULMONARY CRITICAL CARE SERVICE  PROGRESS NOTE  Date of Service: 06/24/2020  Emma Anderson  [id number]:[id number]  DOB: 08-Nov-1960   DOA: 06/15/2020  Referring Physician: Carron Curie, MD  HPI: Emma Anderson is a 60 y.o. female seen for follow up of Acute on Chronic Respiratory Failure.  Patient is on pressure support has been on 35% FiO2 should be able to do T collar today  Medications: Reviewed on Rounds  Physical Exam:  Vitals: Temperature is 98.3 pulse 82 respiratory rate is 18 blood pressure is 107/62 saturations 96%  Ventilator Settings on pressure support FiO2 35%  . General: Comfortable at this time . Eyes: Grossly normal lids, irises & conjunctiva . ENT: grossly tongue is normal . Neck: no obvious mass . Cardiovascular: S1 S2 normal no gallop . Respiratory: Scattered rhonchi very coarse breath sounds . Abdomen: soft . Skin: no rash seen on limited exam . Musculoskeletal: not rigid . Psychiatric:unable to assess . Neurologic: no seizure no involuntary movements         Lab Data:   Basic Metabolic Panel: Recent Labs  Lab 06/18/20 0908 06/21/20 0505 06/23/20 0620  NA 137 136 135  K 4.5 5.0 4.4  CL 96* 95* 95*  CO2 29 28 28   GLUCOSE 229* 147* 166*  BUN 29* 42* 44*  CREATININE 0.81 0.94 0.84  CALCIUM 9.1 10.0 10.1  MG 1.9  --   --   PHOS  --  5.3*  --     ABG: No results for input(s): PHART, PCO2ART, PO2ART, HCO3, O2SAT in the last 168 hours.  Liver Function Tests: Recent Labs  Lab 06/21/20 0505  ALBUMIN 2.8*   No results for input(s): LIPASE, AMYLASE in the last 168 hours. No results for input(s): AMMONIA in the last 168 hours.  CBC: Recent Labs  Lab 06/18/20 0908 06/21/20 0816  WBC 6.5 8.0  HGB 8.6* 11.1*  HCT 26.8* 34.9*  MCV 104.7* 105.4*  PLT 262 288    Cardiac Enzymes: No results for input(s): CKTOTAL, CKMB, CKMBINDEX, TROPONINI in the last 168 hours.  BNP (last 3  results) No results for input(s): BNP in the last 8760 hours.  ProBNP (last 3 results) No results for input(s): PROBNP in the last 8760 hours.  Radiological Exams: No results found.  Assessment/Plan Active Problems:   Acute on chronic respiratory failure with hypoxia (HCC)   COVID-19 virus infection   Tracheostomy status (HCC)   Pneumonia due to COVID-19 virus   Acute respiratory distress syndrome (ARDS) due to COVID-19 virus (HCC)   1. Acute on chronic respiratory failure hypoxia plan is to continue with the weaning advanced to T collar today. 2. COVID-19 virus infection in recovery 3. Tracheostomy remains in place 4. Pneumonia due to COVID-19 treated 5. ARDS slow improvement we will continue to follow along   I have personally seen and evaluated the patient, evaluated laboratory and imaging results, formulated the assessment and plan and placed orders. The Patient requires high complexity decision making with multiple systems involvement.  Rounds were done with the Respiratory Therapy Director and Staff therapists and discussed with nursing staff also.  06/23/20, MD [facility] Pulmonary Critical Care Medicine Sleep Medicine

## 2020-06-25 DIAGNOSIS — J1282 Pneumonia due to coronavirus disease 2019: Secondary | ICD-10-CM | POA: Diagnosis not present

## 2020-06-25 DIAGNOSIS — J9621 Acute and chronic respiratory failure with hypoxia: Secondary | ICD-10-CM | POA: Diagnosis not present

## 2020-06-25 DIAGNOSIS — Z930 Tracheostomy status: Secondary | ICD-10-CM | POA: Diagnosis not present

## 2020-06-25 DIAGNOSIS — U071 COVID-19: Secondary | ICD-10-CM | POA: Diagnosis not present

## 2020-06-25 DIAGNOSIS — J80 Acute respiratory distress syndrome: Secondary | ICD-10-CM | POA: Diagnosis not present

## 2020-06-25 LAB — RENAL FUNCTION PANEL
Albumin: 2.8 g/dL — ABNORMAL LOW (ref 3.5–5.0)
Anion gap: 13 (ref 5–15)
BUN: 45 mg/dL — ABNORMAL HIGH (ref 8–23)
CO2: 30 mmol/L (ref 22–32)
Calcium: 9.9 mg/dL (ref 8.9–10.3)
Chloride: 94 mmol/L — ABNORMAL LOW (ref 98–111)
Creatinine, Ser: 0.8 mg/dL (ref 0.44–1.00)
GFR, Estimated: >60 mL/min (ref >60)
Glucose, Bld: 168 mg/dL — ABNORMAL HIGH (ref 70–99)
Phosphorus: 4.2 mg/dL (ref 2.5–4.6)
Potassium: 4.2 mmol/L (ref 3.5–5.1)
Sodium: 137 mmol/L (ref 135–145)

## 2020-06-25 NOTE — Progress Notes (Signed)
Pulmonary Critical Care Medicine [facility] [place]   PULMONARY CRITICAL CARE SERVICE  PROGRESS NOTE  Date of Service: 06/25/2020  Emma Anderson  EXN:[id number]  DOB: 1960/08/29   DOA: 06/15/2020  Referring Physician: Carron Curie, MD  HPI: Emma Anderson is a 60 y.o. female seen for follow up of Acute on Chronic Respiratory Failure.  Patient currently is on the T collar on 35% FiO2 goal is for 24 hours today  Medications: Reviewed on Rounds  Physical Exam:  Vitals: Temperature is 96.1 pulse 70 respiratory rate is 15 blood pressure is 117/66 saturations 97%  Ventilator Settings on T collar with an FiO2 of 35%  . General: Comfortable at this time . Eyes: Grossly normal lids, irises & conjunctiva . ENT: grossly tongue is normal . Neck: no obvious mass . Cardiovascular: S1 S2 normal no gallop . Respiratory: Scattered rhonchi very coarse breath sounds . Abdomen: soft . Skin: no rash seen on limited exam . Musculoskeletal: not rigid . Psychiatric:unable to assess . Neurologic: no seizure no involuntary movements         Lab Data:   Basic Metabolic Panel: Recent Labs  Lab 06/21/20 0505 06/23/20 0620 06/25/20 0439  NA 136 135 137  K 5.0 4.4 4.2  CL 95* 95* 94*  CO2 28 28 30   GLUCOSE 147* 166* 168*  BUN 42* 44* 45*  CREATININE 0.94 0.84 0.80  CALCIUM 10.0 10.1 9.9  PHOS 5.3*  --  4.2    ABG: No results for input(s): PHART, PCO2ART, PO2ART, HCO3, O2SAT in the last 168 hours.  Liver Function Tests: Recent Labs  Lab 06/21/20 0505 06/25/20 0439  ALBUMIN 2.8* 2.8*   No results for input(s): LIPASE, AMYLASE in the last 168 hours. No results for input(s): AMMONIA in the last 168 hours.  CBC: Recent Labs  Lab 06/21/20 0816  WBC 8.0  HGB 11.1*  HCT 34.9*  MCV 105.4*  PLT 288    Cardiac Enzymes: No results for input(s): CKTOTAL, CKMB, CKMBINDEX, TROPONINI in the last 168 hours.  BNP (last 3 results) No results for input(s): BNP in the last  8760 hours.  ProBNP (last 3 results) No results for input(s): PROBNP in the last 8760 hours.  Radiological Exams: No results found.  Assessment/Plan Active Problems:   Acute on chronic respiratory failure with hypoxia (HCC)   COVID-19 virus infection   Tracheostomy status (HCC)   Pneumonia due to COVID-19 virus   Acute respiratory distress syndrome (ARDS) due to COVID-19 virus (HCC)   1. Acute on chronic respiratory failure hypoxia we will continue T collar titrate oxygen continue pulmonary toilet. 2. COVID-19 virus infection recovery 3. Tracheostomy doing well with weaning 4. Pneumonia due to COVID-19 treated slow improvement 5. ARDS improving   I have personally seen and evaluated the patient, evaluated laboratory and imaging results, formulated the assessment and plan and placed orders. The Patient requires high complexity decision making with multiple systems involvement.  Rounds were done with the Respiratory Therapy Director and Staff therapists and discussed with nursing staff also.  06/23/20, MD [facility] Pulmonary Critical Care Medicine Sleep Medicine

## 2020-06-26 DIAGNOSIS — J1282 Pneumonia due to coronavirus disease 2019: Secondary | ICD-10-CM | POA: Diagnosis not present

## 2020-06-26 DIAGNOSIS — U071 COVID-19: Secondary | ICD-10-CM | POA: Diagnosis not present

## 2020-06-26 DIAGNOSIS — J9621 Acute and chronic respiratory failure with hypoxia: Secondary | ICD-10-CM | POA: Diagnosis not present

## 2020-06-26 DIAGNOSIS — J80 Acute respiratory distress syndrome: Secondary | ICD-10-CM | POA: Diagnosis not present

## 2020-06-26 DIAGNOSIS — Z930 Tracheostomy status: Secondary | ICD-10-CM | POA: Diagnosis not present

## 2020-06-26 LAB — CBC
HCT: 26 % — ABNORMAL LOW (ref 36.0–46.0)
Hemoglobin: 8.8 g/dL — ABNORMAL LOW (ref 12.0–15.0)
MCH: 34.5 pg — ABNORMAL HIGH (ref 26.0–34.0)
MCHC: 33.8 g/dL (ref 30.0–36.0)
MCV: 102 fL — ABNORMAL HIGH (ref 80.0–100.0)
Platelets: 181 10*3/uL (ref 150–400)
RBC: 2.55 MIL/uL — ABNORMAL LOW (ref 3.87–5.11)
RDW: 12.9 % (ref 11.5–15.5)
WBC: 3.6 10*3/uL — ABNORMAL LOW (ref 4.0–10.5)
nRBC: 0 % (ref 0.0–0.2)

## 2020-06-26 LAB — PHOSPHORUS: Phosphorus: 3.5 mg/dL (ref 2.5–4.6)

## 2020-06-26 LAB — BASIC METABOLIC PANEL
Anion gap: 11 (ref 5–15)
BUN: 34 mg/dL — ABNORMAL HIGH (ref 8–23)
CO2: 30 mmol/L (ref 22–32)
Calcium: 9.2 mg/dL (ref 8.9–10.3)
Chloride: 97 mmol/L — ABNORMAL LOW (ref 98–111)
Creatinine, Ser: 0.74 mg/dL (ref 0.44–1.00)
GFR, Estimated: >60 mL/min (ref >60)
Glucose, Bld: 122 mg/dL — ABNORMAL HIGH (ref 70–99)
Potassium: 4 mmol/L (ref 3.5–5.1)
Sodium: 138 mmol/L (ref 135–145)

## 2020-06-26 LAB — MAGNESIUM: Magnesium: 2.1 mg/dL (ref 1.7–2.4)

## 2020-06-26 NOTE — Progress Notes (Signed)
Pulmonary Critical Care Medicine [facility] [place]   PULMONARY CRITICAL CARE SERVICE  PROGRESS NOTE  Date of Service: 06/26/2020  Emma Anderson  NGE:[id number]  DOB: May 19, 1961   DOA: 06/15/2020  Referring Physician: Carron Curie, MD  HPI: Emma Anderson is a 60 y.o. female seen for follow up of Acute on Chronic Respiratory Failure.  Patient currently is on T collar has been on 35% FiO2 good saturations  Medications: Reviewed on Rounds  Physical Exam:  Vitals: Temperature is 97.1 pulse 73 respiratory 17 blood pressure is 110/70 saturations 99%  Ventilator Settings off the ventilator on T collar FiO2 35%  . General: Comfortable at this time . Eyes: Grossly normal lids, irises & conjunctiva . ENT: grossly tongue is normal . Neck: no obvious mass . Cardiovascular: S1 S2 normal no gallop . Respiratory: Scattered rhonchi expansion is equal . Abdomen: soft . Skin: no rash seen on limited exam . Musculoskeletal: not rigid . Psychiatric:unable to assess . Neurologic: no seizure no involuntary movements         Lab Data:   Basic Metabolic Panel: Recent Labs  Lab 06/21/20 0505 06/23/20 0620 06/25/20 0439 06/26/20 0422  NA 136 135 137 138  K 5.0 4.4 4.2 4.0  CL 95* 95* 94* 97*  CO2 28 28 30 30   GLUCOSE 147* 166* 168* 122*  BUN 42* 44* 45* 34*  CREATININE 0.94 0.84 0.80 0.74  CALCIUM 10.0 10.1 9.9 9.2  MG  --   --   --  2.1  PHOS 5.3*  --  4.2 3.5    ABG: No results for input(s): PHART, PCO2ART, PO2ART, HCO3, O2SAT in the last 168 hours.  Liver Function Tests: Recent Labs  Lab 06/21/20 0505 06/25/20 0439  ALBUMIN 2.8* 2.8*   No results for input(s): LIPASE, AMYLASE in the last 168 hours. No results for input(s): AMMONIA in the last 168 hours.  CBC: Recent Labs  Lab 06/21/20 0816 06/26/20 0422  WBC 8.0 3.6*  HGB 11.1* 8.8*  HCT 34.9* 26.0*  MCV 105.4* 102.0*  PLT 288 181    Cardiac Enzymes: No results for input(s): CKTOTAL, CKMB,  CKMBINDEX, TROPONINI in the last 168 hours.  BNP (last 3 results) No results for input(s): BNP in the last 8760 hours.  ProBNP (last 3 results) No results for input(s): PROBNP in the last 8760 hours.  Radiological Exams: No results found.  Assessment/Plan Active Problems:   Acute on chronic respiratory failure with hypoxia (HCC)   COVID-19 virus infection   Tracheostomy status (HCC)   Pneumonia due to COVID-19 virus   Acute respiratory distress syndrome (ARDS) due to COVID-19 virus (HCC)   1. Acute on chronic respiratory failure hypoxia we will continue with T-piece today goal is 48 hours 2. COVID-19 virus infection recovery 3. Tracheostomy doing fine so far 4. Pneumonia due to COVID-19 slow improvement 5. ARDS patient still severe disease need to continue to monitor   I have personally seen and evaluated the patient, evaluated laboratory and imaging results, formulated the assessment and plan and placed orders. The Patient requires high complexity decision making with multiple systems involvement.  Rounds were done with the Respiratory Therapy Director and Staff therapists and discussed with nursing staff also.  08/24/20, MD [facility] Pulmonary Critical Care Medicine Sleep Medicine

## 2020-06-27 ENCOUNTER — Other Ambulatory Visit (HOSPITAL_COMMUNITY): Payer: Medicare (Managed Care)

## 2020-06-27 DIAGNOSIS — J1282 Pneumonia due to coronavirus disease 2019: Secondary | ICD-10-CM | POA: Diagnosis not present

## 2020-06-27 DIAGNOSIS — J80 Acute respiratory distress syndrome: Secondary | ICD-10-CM | POA: Diagnosis not present

## 2020-06-27 DIAGNOSIS — U071 COVID-19: Secondary | ICD-10-CM | POA: Diagnosis not present

## 2020-06-27 DIAGNOSIS — Z930 Tracheostomy status: Secondary | ICD-10-CM | POA: Diagnosis not present

## 2020-06-27 DIAGNOSIS — J9621 Acute and chronic respiratory failure with hypoxia: Secondary | ICD-10-CM | POA: Diagnosis not present

## 2020-06-27 NOTE — Progress Notes (Signed)
Pulmonary Critical Care Medicine [facility] [place]   PULMONARY CRITICAL CARE SERVICE  PROGRESS NOTE  Date of Service: 06/27/2020  Emma Anderson  [id number]  DOB: Jun 23, 1960   DOA: 06/15/2020  Referring Physician: Carron Curie, MD  HPI: Emma Anderson is a 60 y.o. female seen for follow up of Acute on Chronic Respiratory Failure.  Patient at this time is off the ventilator on T collar currently on 35% FiO2  Medications: Reviewed on Rounds  Physical Exam:  Vitals: Temperature is 97.2 pulse 94 respiratory 22 blood pressure is 130/68 saturations 95%  Ventilator Settings on T collar FiO2 of 35%  . General: Comfortable at this time . Eyes: Grossly normal lids, irises & conjunctiva . ENT: grossly tongue is normal . Neck: no obvious mass . Cardiovascular: S1 S2 normal no gallop . Respiratory: Scattered rhonchi expansion is equal . Abdomen: soft . Skin: no rash seen on limited exam . Musculoskeletal: not rigid . Psychiatric:unable to assess . Neurologic: no seizure no involuntary movements         Lab Data:   Basic Metabolic Panel: Recent Labs  Lab 06/21/20 0505 06/23/20 0620 06/25/20 0439 06/26/20 0422  NA 136 135 137 138  K 5.0 4.4 4.2 4.0  CL 95* 95* 94* 97*  CO2 28 28 30 30   GLUCOSE 147* 166* 168* 122*  BUN 42* 44* 45* 34*  CREATININE 0.94 0.84 0.80 0.74  CALCIUM 10.0 10.1 9.9 9.2  MG  --   --   --  2.1  PHOS 5.3*  --  4.2 3.5    ABG: No results for input(s): PHART, PCO2ART, PO2ART, HCO3, O2SAT in the last 168 hours.  Liver Function Tests: Recent Labs  Lab 06/21/20 0505 06/25/20 0439  ALBUMIN 2.8* 2.8*   No results for input(s): LIPASE, AMYLASE in the last 168 hours. No results for input(s): AMMONIA in the last 168 hours.  CBC: Recent Labs  Lab 06/21/20 0816 06/26/20 0422  WBC 8.0 3.6*  HGB 11.1* 8.8*  HCT 34.9* 26.0*  MCV 105.4* 102.0*  PLT 288 181    Cardiac Enzymes: No results for input(s): CKTOTAL, CKMB, CKMBINDEX,  TROPONINI in the last 168 hours.  BNP (last 3 results) No results for input(s): BNP in the last 8760 hours.  ProBNP (last 3 results) No results for input(s): PROBNP in the last 8760 hours.  Radiological Exams: No results found.  Assessment/Plan Active Problems:   Acute on chronic respiratory failure with hypoxia (HCC)   COVID-19 virus infection   Tracheostomy status (HCC)   Pneumonia due to COVID-19 virus   Acute respiratory distress syndrome (ARDS) due to COVID-19 virus (HCC)   1. Acute on chronic respiratory failure hypoxia we will continue with T-piece titrate oxygen continue pulmonary toilet 2. COVID-19 virus infection recovery 3. Tracheostomy remains in place 4. Pneumonia due to COVID-19 treated slowly improving 5. ARDS improved   I have personally seen and evaluated the patient, evaluated laboratory and imaging results, formulated the assessment and plan and placed orders. The Patient requires high complexity decision making with multiple systems involvement.  Rounds were done with the Respiratory Therapy Director and Staff therapists and discussed with nursing staff also.  08/24/20, MD [facility] Pulmonary Critical Care Medicine Sleep Medicine

## 2020-06-28 DIAGNOSIS — U071 COVID-19: Secondary | ICD-10-CM | POA: Diagnosis not present

## 2020-06-28 DIAGNOSIS — J9621 Acute and chronic respiratory failure with hypoxia: Secondary | ICD-10-CM | POA: Diagnosis not present

## 2020-06-28 DIAGNOSIS — J80 Acute respiratory distress syndrome: Secondary | ICD-10-CM | POA: Diagnosis not present

## 2020-06-28 DIAGNOSIS — J1282 Pneumonia due to coronavirus disease 2019: Secondary | ICD-10-CM | POA: Diagnosis not present

## 2020-06-28 DIAGNOSIS — Z930 Tracheostomy status: Secondary | ICD-10-CM | POA: Diagnosis not present

## 2020-06-28 LAB — BLOOD GAS, ARTERIAL
Acid-Base Excess: 6.3 mmol/L — ABNORMAL HIGH (ref 0.0–2.0)
Bicarbonate: 30.7 mmol/L — ABNORMAL HIGH (ref 20.0–28.0)
FIO2: 50
O2 Saturation: 84.6 %
Patient temperature: 36.6
pCO2 arterial: 46.5 mmHg (ref 32.0–48.0)
pH, Arterial: 7.433 (ref 7.350–7.450)
pO2, Arterial: 48.9 mmHg — ABNORMAL LOW (ref 83.0–108.0)

## 2020-06-28 NOTE — Progress Notes (Signed)
Pulmonary Critical Care Medicine [facility] [place]   PULMONARY CRITICAL CARE SERVICE  PROGRESS NOTE  Date of Service: 06/28/2020  Emma Anderson  OIN:[id number]  DOB: 07-Mar-1961   DOA: 06/15/2020  Referring Physician: Carron Curie, MD  HPI: Emma Anderson is a 60 y.o. female seen for follow up of Acute on Chronic Respiratory Failure.  Off the ventilator on T collar currently requiring 40% FiO2  Medications: Reviewed on Rounds  Physical Exam:  Vitals: Patient temperature is 97.1 pulse 78 respiratory rate 21 blood pressure is 140/85 saturations 92%  Ventilator Settings on T collar with an FiO2 of 40%  . General: Comfortable at this time . Eyes: Grossly normal lids, irises & conjunctiva . ENT: grossly tongue is normal . Neck: no obvious mass . Cardiovascular: S1 S2 normal no gallop . Respiratory: Scattered rhonchi expansion is equal . Abdomen: soft . Skin: no rash seen on limited exam . Musculoskeletal: not rigid . Psychiatric:unable to assess . Neurologic: no seizure no involuntary movements         Lab Data:   Basic Metabolic Panel: Recent Labs  Lab 06/23/20 0620 06/25/20 0439 06/26/20 0422  NA 135 137 138  K 4.4 4.2 4.0  CL 95* 94* 97*  CO2 28 30 30   GLUCOSE 166* 168* 122*  BUN 44* 45* 34*  CREATININE 0.84 0.80 0.74  CALCIUM 10.1 9.9 9.2  MG  --   --  2.1  PHOS  --  4.2 3.5    ABG: No results for input(s): PHART, PCO2ART, PO2ART, HCO3, O2SAT in the last 168 hours.  Liver Function Tests: Recent Labs  Lab 06/25/20 0439  ALBUMIN 2.8*   No results for input(s): LIPASE, AMYLASE in the last 168 hours. No results for input(s): AMMONIA in the last 168 hours.  CBC: Recent Labs  Lab 06/26/20 0422  WBC 3.6*  HGB 8.8*  HCT 26.0*  MCV 102.0*  PLT 181    Cardiac Enzymes: No results for input(s): CKTOTAL, CKMB, CKMBINDEX, TROPONINI in the last 168 hours.  BNP (last 3 results) No results for input(s): BNP in the last 8760 hours.  ProBNP  (last 3 results) No results for input(s): PROBNP in the last 8760 hours.  Radiological Exams: No results found.  Assessment/Plan Active Problems:   Acute on chronic respiratory failure with hypoxia (HCC)   COVID-19 virus infection   Tracheostomy status (HCC)   Pneumonia due to COVID-19 virus   Acute respiratory distress syndrome (ARDS) due to COVID-19 virus (HCC)   1. Acute on chronic respiratory failure with hypoxia we will continue with T collar trials patient is right now on 40% FiO2. 2. COVID-19 virus infection recovery 3. Tracheostomy remains in place 4. Pneumonia due to COVID-19 treated 5. ARDS patient is at base   I have personally seen and evaluated the patient, evaluated laboratory and imaging results, formulated the assessment and plan and placed orders. The Patient requires high complexity decision making with multiple systems involvement.  Rounds were done with the Respiratory Therapy Director and Staff therapists and discussed with nursing staff also.  08/24/20, MD [facility] Pulmonary Critical Care Medicine Sleep Medicine

## 2020-06-29 ENCOUNTER — Other Ambulatory Visit (HOSPITAL_COMMUNITY): Payer: Medicare (Managed Care)

## 2020-06-29 DIAGNOSIS — J80 Acute respiratory distress syndrome: Secondary | ICD-10-CM | POA: Diagnosis not present

## 2020-06-29 DIAGNOSIS — U071 COVID-19: Secondary | ICD-10-CM | POA: Diagnosis not present

## 2020-06-29 DIAGNOSIS — J1282 Pneumonia due to coronavirus disease 2019: Secondary | ICD-10-CM | POA: Diagnosis not present

## 2020-06-29 DIAGNOSIS — Z930 Tracheostomy status: Secondary | ICD-10-CM | POA: Diagnosis not present

## 2020-06-29 DIAGNOSIS — J9621 Acute and chronic respiratory failure with hypoxia: Secondary | ICD-10-CM | POA: Diagnosis not present

## 2020-06-29 LAB — BLOOD GAS, ARTERIAL
Acid-Base Excess: 7.7 mmol/L — ABNORMAL HIGH (ref 0.0–2.0)
Bicarbonate: 31.9 mmol/L — ABNORMAL HIGH (ref 20.0–28.0)
FIO2: 40
O2 Saturation: 84.5 %
Patient temperature: 36.3
pCO2 arterial: 44.4 mmHg (ref 32.0–48.0)
pH, Arterial: 7.466 — ABNORMAL HIGH (ref 7.350–7.450)
pO2, Arterial: 47.4 mmHg — ABNORMAL LOW (ref 83.0–108.0)

## 2020-06-29 LAB — MAGNESIUM: Magnesium: 2 mg/dL (ref 1.7–2.4)

## 2020-06-29 LAB — PHOSPHORUS: Phosphorus: 4.5 mg/dL (ref 2.5–4.6)

## 2020-06-29 LAB — CBC
HCT: 29.6 % — ABNORMAL LOW (ref 36.0–46.0)
Hemoglobin: 9.5 g/dL — ABNORMAL LOW (ref 12.0–15.0)
MCH: 33.5 pg (ref 26.0–34.0)
MCHC: 32.1 g/dL (ref 30.0–36.0)
MCV: 104.2 fL — ABNORMAL HIGH (ref 80.0–100.0)
Platelets: 178 10*3/uL (ref 150–400)
RBC: 2.84 MIL/uL — ABNORMAL LOW (ref 3.87–5.11)
RDW: 13.6 % (ref 11.5–15.5)
WBC: 3.6 10*3/uL — ABNORMAL LOW (ref 4.0–10.5)
nRBC: 0 % (ref 0.0–0.2)

## 2020-06-29 LAB — BASIC METABOLIC PANEL
Anion gap: 12 (ref 5–15)
BUN: 23 mg/dL (ref 8–23)
CO2: 30 mmol/L (ref 22–32)
Calcium: 9.9 mg/dL (ref 8.9–10.3)
Chloride: 95 mmol/L — ABNORMAL LOW (ref 98–111)
Creatinine, Ser: 0.66 mg/dL (ref 0.44–1.00)
GFR, Estimated: >60 mL/min (ref >60)
Glucose, Bld: 94 mg/dL (ref 70–99)
Potassium: 3.6 mmol/L (ref 3.5–5.1)
Sodium: 137 mmol/L (ref 135–145)

## 2020-06-29 NOTE — Progress Notes (Signed)
Pulmonary Critical Care Medicine [facility] [place]   PULMONARY CRITICAL CARE SERVICE  PROGRESS NOTE  Date of Service: 06/29/2020  Emma Anderson  NWG:[id number]  DOB: 07/21/1960   DOA: 06/15/2020  Referring Physician: Carron Curie, MD  HPI: Emma Anderson is a 60 y.o. female seen for follow up of Acute on Chronic Respiratory Failure.  Patient at this time is on T collar has been on 40% FiO2 using the PMV  Medications: Reviewed on Rounds  Physical Exam:  Vitals: Temperature 97.4 pulse 65 respiratory rate 16 blood pressure is 112/76 saturations 100%  Ventilator Settings on T collar with an FiO2 of 40%  . General: Comfortable at this time . Eyes: Grossly normal lids, irises & conjunctiva . ENT: grossly tongue is normal . Neck: no obvious mass . Cardiovascular: S1 S2 normal no gallop . Respiratory: Coarse breath sounds with few scattered rhonchi . Abdomen: soft . Skin: no rash seen on limited exam . Musculoskeletal: not rigid . Psychiatric:unable to assess . Neurologic: no seizure no involuntary movements         Lab Data:   Basic Metabolic Panel: Recent Labs  Lab 06/23/20 0620 06/25/20 0439 06/26/20 0422 06/29/20 0540  NA 135 137 138 137  K 4.4 4.2 4.0 3.6  CL 95* 94* 97* 95*  CO2 28 30 30 30   GLUCOSE 166* 168* 122* 94  BUN 44* 45* 34* 23  CREATININE 0.84 0.80 0.74 0.66  CALCIUM 10.1 9.9 9.2 9.9  MG  --   --  2.1 2.0  PHOS  --  4.2 3.5 4.5    ABG: Recent Labs  Lab 06/28/20 1034 06/29/20 0515  PHART 7.433 7.466*  PCO2ART 46.5 44.4  PO2ART 48.9* 47.4*  HCO3 30.7* 31.9*  O2SAT 84.6 84.5    Liver Function Tests: Recent Labs  Lab 06/25/20 0439  ALBUMIN 2.8*   No results for input(s): LIPASE, AMYLASE in the last 168 hours. No results for input(s): AMMONIA in the last 168 hours.  CBC: Recent Labs  Lab 06/26/20 0422 06/29/20 0540  WBC 3.6* 3.6*  HGB 8.8* 9.5*  HCT 26.0* 29.6*  MCV 102.0* 104.2*  PLT 181 178    Cardiac  Enzymes: No results for input(s): CKTOTAL, CKMB, CKMBINDEX, TROPONINI in the last 168 hours.  BNP (last 3 results) No results for input(s): BNP in the last 8760 hours.  ProBNP (last 3 results) No results for input(s): PROBNP in the last 8760 hours.  Radiological Exams: DG CHEST PORT 1 VIEW  Result Date: 06/29/2020 CLINICAL DATA:  Pneumonia EXAM: PORTABLE CHEST 1 VIEW COMPARISON:  06/20/2020 FINDINGS: Chronic cardiomegaly. The tracheostomy tube remains in place. Infiltrates on the left more than right with subpleural predilection especially on the left. No convincing change from before. Artifact from EKG leads. IMPRESSION: Stable inflation and infiltrates. Electronically Signed   By: 06/22/2020 M.D.   On: 06/29/2020 06:09    Assessment/Plan Active Problems:   Acute on chronic respiratory failure with hypoxia (HCC)   COVID-19 virus infection   Tracheostomy status (HCC)   Pneumonia due to COVID-19 virus   Acute respiratory distress syndrome (ARDS) due to COVID-19 virus (HCC)   1. Acute on chronic respiratory failure hypoxia we will continue with T collar titrate oxygen continue pulmonary toilet 2. COVID-19 virus infection with Covid 3. Pneumonia due to COVID-19 treated slowly improving 4. Tracheostomy remains in place 5. ARDS slow improvement we will continue to monitor closely   I have personally seen and evaluated the patient, evaluated  laboratory and imaging results, formulated the assessment and plan and placed orders. The Patient requires high complexity decision making with multiple systems involvement.  Rounds were done with the Respiratory Therapy Director and Staff therapists and discussed with nursing staff also.  Allyne Gee, MD [facility] Pulmonary Critical Care Medicine Sleep Medicine

## 2020-06-30 DIAGNOSIS — J9621 Acute and chronic respiratory failure with hypoxia: Secondary | ICD-10-CM | POA: Diagnosis not present

## 2020-06-30 DIAGNOSIS — U071 COVID-19: Secondary | ICD-10-CM | POA: Diagnosis not present

## 2020-06-30 DIAGNOSIS — J1282 Pneumonia due to coronavirus disease 2019: Secondary | ICD-10-CM | POA: Diagnosis not present

## 2020-06-30 DIAGNOSIS — J80 Acute respiratory distress syndrome: Secondary | ICD-10-CM | POA: Diagnosis not present

## 2020-06-30 DIAGNOSIS — Z930 Tracheostomy status: Secondary | ICD-10-CM | POA: Diagnosis not present

## 2020-06-30 NOTE — Progress Notes (Signed)
Pulmonary Critical Care Medicine [facility] [place]   PULMONARY CRITICAL CARE SERVICE  PROGRESS NOTE  Date of Service: 06/30/2020  Emma Anderson  UKG:[id number]  DOB: [birth year]   DOA: 06/15/2020  Referring Physician: Carron Curie, MD  HPI: Emma Anderson is a 60 y.o. female seen for follow up of Acute on Chronic Respiratory Failure.  Patient currently is on T collar had some issues with desaturations noted now appears to be doing better.  Medications: Reviewed on Rounds  Physical Exam:  Vitals: Temperature 96.1 pulse 65 respiratory 28 blood pressure is 103/43 saturations 95%  Ventilator Settings off the ventilator on T collar currently on 40% FiO2   General: Comfortable at this time  Eyes: Grossly normal lids, irises & conjunctiva  ENT: grossly tongue is normal  Neck: no obvious mass  Cardiovascular: S1 S2 normal no gallop  Respiratory: Scattered rhonchi expansion is equal  Abdomen: soft  Skin: no rash seen on limited exam  Musculoskeletal: not rigid  Psychiatric:unable to assess  Neurologic: no seizure no involuntary movements         Lab Data:   Basic Metabolic Panel: Recent Labs  Lab 06/25/20 0439 06/26/20 0422 06/29/20 0540  NA 137 138 137  K 4.2 4.0 3.6  CL 94* 97* 95*  CO2 30 30 30   GLUCOSE 168* 122* 94  BUN 45* 34* 23  CREATININE 0.80 0.74 0.66  CALCIUM 9.9 9.2 9.9  MG  --  2.1 2.0  PHOS 4.2 3.5 4.5    ABG: Recent Labs  Lab 06/28/20 1034 06/29/20 0515  PHART 7.433 7.466*  PCO2ART 46.5 44.4  PO2ART 48.9* 47.4*  HCO3 30.7* 31.9*  O2SAT 84.6 84.5    Liver Function Tests: Recent Labs  Lab 06/25/20 0439  ALBUMIN 2.8*   No results for input(s): LIPASE, AMYLASE in the last 168 hours. No results for input(s): AMMONIA in the last 168 hours.  CBC: Recent Labs  Lab 06/26/20 0422 06/29/20 0540  WBC 3.6* 3.6*  HGB 8.8* 9.5*  HCT 26.0* 29.6*  MCV 102.0* 104.2*  PLT 181 178    Cardiac Enzymes: No results for  input(s): CKTOTAL, CKMB, CKMBINDEX, TROPONINI in the last 168 hours.  BNP (last 3 results) No results for input(s): BNP in the last 8760 hours.  ProBNP (last 3 results) No results for input(s): PROBNP in the last 8760 hours.  Radiological Exams: DG CHEST PORT 1 VIEW  Result Date: 06/29/2020 CLINICAL DATA:  Pneumonia EXAM: PORTABLE CHEST 1 VIEW COMPARISON:  06/20/2020 FINDINGS: Chronic cardiomegaly. The tracheostomy tube remains in place. Infiltrates on the left more than right with subpleural predilection especially on the left. No convincing change from before. Artifact from EKG leads. IMPRESSION: Stable inflation and infiltrates. Electronically Signed   By: 06/22/2020 M.D.   On: 06/29/2020 06:09    Assessment/Plan Active Problems:   Acute on chronic respiratory failure with hypoxia (HCC)   COVID-19 virus infection   Tracheostomy status (HCC)   Pneumonia due to COVID-19 virus   Acute respiratory distress syndrome (ARDS) due to COVID-19 virus (HCC)   1. Acute on chronic respiratory failure hypoxia we will continue with the T collar on 40% FiO2 wean as tolerated 2. COVID-19 virus infection recovery. 3. Tracheostomy remains in place 4. Pneumonia due to COVID-19 treated slow improvement 5. ARDS slow improvement   I have personally seen and evaluated the patient, evaluated laboratory and imaging results, formulated the assessment and plan and placed orders. The Patient requires high complexity decision making with  multiple systems involvement.  Rounds were done with the Respiratory Therapy Director and Staff therapists and discussed with nursing staff also.  Allyne Gee, MD [facility] Pulmonary Critical Care Medicine Sleep Medicine

## 2020-07-02 DIAGNOSIS — Z930 Tracheostomy status: Secondary | ICD-10-CM | POA: Diagnosis not present

## 2020-07-02 DIAGNOSIS — U071 COVID-19: Secondary | ICD-10-CM | POA: Diagnosis not present

## 2020-07-02 DIAGNOSIS — J9621 Acute and chronic respiratory failure with hypoxia: Secondary | ICD-10-CM | POA: Diagnosis not present

## 2020-07-02 DIAGNOSIS — J1282 Pneumonia due to coronavirus disease 2019: Secondary | ICD-10-CM | POA: Diagnosis not present

## 2020-07-02 DIAGNOSIS — J80 Acute respiratory distress syndrome: Secondary | ICD-10-CM | POA: Diagnosis not present

## 2020-07-02 LAB — CBC
HCT: 25.6 % — ABNORMAL LOW (ref 36.0–46.0)
Hemoglobin: 8.5 g/dL — ABNORMAL LOW (ref 12.0–15.0)
MCH: 35 pg — ABNORMAL HIGH (ref 26.0–34.0)
MCHC: 33.2 g/dL (ref 30.0–36.0)
MCV: 105.3 fL — ABNORMAL HIGH (ref 80.0–100.0)
Platelets: 173 10*3/uL (ref 150–400)
RBC: 2.43 MIL/uL — ABNORMAL LOW (ref 3.87–5.11)
RDW: 15.2 % (ref 11.5–15.5)
WBC: 4.6 10*3/uL (ref 4.0–10.5)
nRBC: 0 % (ref 0.0–0.2)

## 2020-07-02 LAB — URINALYSIS, ROUTINE W REFLEX MICROSCOPIC
Bilirubin Urine: NEGATIVE
Glucose, UA: NEGATIVE mg/dL
Hgb urine dipstick: NEGATIVE
Ketones, ur: NEGATIVE mg/dL
Nitrite: NEGATIVE
Protein, ur: NEGATIVE mg/dL
Specific Gravity, Urine: 1.006 (ref 1.005–1.030)
WBC, UA: >50 WBC/hpf — ABNORMAL HIGH (ref 0–5)
pH: 8 (ref 5.0–8.0)

## 2020-07-02 LAB — BASIC METABOLIC PANEL
Anion gap: 11 (ref 5–15)
BUN: 22 mg/dL (ref 8–23)
CO2: 29 mmol/L (ref 22–32)
Calcium: 9.9 mg/dL (ref 8.9–10.3)
Chloride: 99 mmol/L (ref 98–111)
Creatinine, Ser: 0.77 mg/dL (ref 0.44–1.00)
GFR, Estimated: >60 mL/min (ref >60)
Glucose, Bld: 122 mg/dL — ABNORMAL HIGH (ref 70–99)
Potassium: 4 mmol/L (ref 3.5–5.1)
Sodium: 139 mmol/L (ref 135–145)

## 2020-07-02 LAB — MAGNESIUM: Magnesium: 2 mg/dL (ref 1.7–2.4)

## 2020-07-02 NOTE — Progress Notes (Signed)
Pulmonary Critical Care Medicine [facility] [place]   PULMONARY CRITICAL CARE SERVICE  PROGRESS NOTE  Date of Service: 07/02/2020  Emma Anderson  PIR:[id number]  DOB: 1961/04/22   DOA: 06/15/2020  Referring Physician: Carron Curie, MD  HPI: Emma Anderson is a 60 y.o. female seen for follow up of Acute on Chronic Respiratory Failure.  Patient at this time is on T collar has been on 40% FiO2 using the PMV  Medications: Reviewed on Rounds  Physical Exam:  Vitals: Temperature 98.4 pulse 73 respiratory 23 blood pressure is 100/69 saturations 98%  Ventilator Settings off ventilator on T collar FiO2 40%  . General: Comfortable at this time . Eyes: Grossly normal lids, irises & conjunctiva . ENT: grossly tongue is normal . Neck: no obvious mass . Cardiovascular: S1 S2 normal no gallop . Respiratory: Scattered rhonchi expansion is equal . Abdomen: soft . Skin: no rash seen on limited exam . Musculoskeletal: not rigid . Psychiatric:unable to assess . Neurologic: no seizure no involuntary movements         Lab Data:   Basic Metabolic Panel: Recent Labs  Lab 06/26/20 0422 06/29/20 0540 07/02/20 0412  NA 138 137 139  K 4.0 3.6 4.0  CL 97* 95* 99  CO2 30 30 29   GLUCOSE 122* 94 122*  BUN 34* 23 22  CREATININE 0.74 0.66 0.77  CALCIUM 9.2 9.9 9.9  MG 2.1 2.0 2.0  PHOS 3.5 4.5  --     ABG: Recent Labs  Lab 06/28/20 1034 06/29/20 0515  PHART 7.433 7.466*  PCO2ART 46.5 44.4  PO2ART 48.9* 47.4*  HCO3 30.7* 31.9*  O2SAT 84.6 84.5    Liver Function Tests: No results for input(s): AST, ALT, ALKPHOS, BILITOT, PROT, ALBUMIN in the last 168 hours. No results for input(s): LIPASE, AMYLASE in the last 168 hours. No results for input(s): AMMONIA in the last 168 hours.  CBC: Recent Labs  Lab 06/26/20 0422 06/29/20 0540 07/02/20 0412  WBC 3.6* 3.6* 4.6  HGB 8.8* 9.5* 8.5*  HCT 26.0* 29.6* 25.6*  MCV 102.0* 104.2* 105.3*  PLT 181 178 173    Cardiac  Enzymes: No results for input(s): CKTOTAL, CKMB, CKMBINDEX, TROPONINI in the last 168 hours.  BNP (last 3 results) No results for input(s): BNP in the last 8760 hours.  ProBNP (last 3 results) No results for input(s): PROBNP in the last 8760 hours.  Radiological Exams: No results found.  Assessment/Plan Active Problems:   Acute on chronic respiratory failure with hypoxia (HCC)   COVID-19 virus infection   Tracheostomy status (HCC)   Pneumonia due to COVID-19 virus   Acute respiratory distress syndrome (ARDS) due to COVID-19 virus (HCC)   1. Acute on chronic respiratory failure hypoxia we will continue with trials continue pulmonary toilet supportive care 2. COVID-19 virus infection recovery phase 3. Pneumonia due to COVID-19 treated we will continue to monitor closely. 4. ARDS again slow improvement noted   I have personally seen and evaluated the patient, evaluated laboratory and imaging results, formulated the assessment and plan and placed orders. The Patient requires high complexity decision making with multiple systems involvement.  Rounds were done with the Respiratory Therapy Director and Staff therapists and discussed with nursing staff also.  08/30/20, MD [facility] Pulmonary Critical Care Medicine Sleep Medicine

## 2020-07-03 DIAGNOSIS — J9621 Acute and chronic respiratory failure with hypoxia: Secondary | ICD-10-CM | POA: Diagnosis not present

## 2020-07-03 DIAGNOSIS — J80 Acute respiratory distress syndrome: Secondary | ICD-10-CM | POA: Diagnosis not present

## 2020-07-03 DIAGNOSIS — J1282 Pneumonia due to coronavirus disease 2019: Secondary | ICD-10-CM | POA: Diagnosis not present

## 2020-07-03 DIAGNOSIS — U071 COVID-19: Secondary | ICD-10-CM | POA: Diagnosis not present

## 2020-07-03 DIAGNOSIS — Z930 Tracheostomy status: Secondary | ICD-10-CM | POA: Diagnosis not present

## 2020-07-03 LAB — URINE CULTURE: Culture: NO GROWTH

## 2020-07-03 NOTE — Progress Notes (Signed)
Pulmonary Critical Care Medicine [facility] [place]   PULMONARY CRITICAL CARE SERVICE  PROGRESS NOTE  Date of Service: 07/03/2020  Emma Anderson  YKZ:[id number]  DOB: 28-Mar-1961   DOA: 06/15/2020  Referring Physician: Carron Curie, MD  HPI: Emma Anderson is a 60 y.o. female seen for follow up of Acute on Chronic Respiratory Failure.  Patient at this time is on T collar has been on 40% FiO2 good saturations are noted  Medications: Reviewed on Rounds  Physical Exam:  Vitals: Temperature is 97.9 pulse 70 respiratory 23 blood pressure is 93/69 saturations 97%  Ventilator Settings on T collar with an FiO2 of 40%  . General: Comfortable at this time . Eyes: Grossly normal lids, irises & conjunctiva . ENT: grossly tongue is normal . Neck: no obvious mass . Cardiovascular: S1 S2 normal no gallop . Respiratory: Scattered rhonchi expansion is equal . Abdomen: soft . Skin: no rash seen on limited exam . Musculoskeletal: not rigid . Psychiatric:unable to assess . Neurologic: no seizure no involuntary movements         Lab Data:   Basic Metabolic Panel: Recent Labs  Lab 06/29/20 0540 07/02/20 0412  NA 137 139  K 3.6 4.0  CL 95* 99  CO2 30 29  GLUCOSE 94 122*  BUN 23 22  CREATININE 0.66 0.77  CALCIUM 9.9 9.9  MG 2.0 2.0  PHOS 4.5  --     ABG: Recent Labs  Lab 06/28/20 1034 06/29/20 0515  PHART 7.433 7.466*  PCO2ART 46.5 44.4  PO2ART 48.9* 47.4*  HCO3 30.7* 31.9*  O2SAT 84.6 84.5    Liver Function Tests: No results for input(s): AST, ALT, ALKPHOS, BILITOT, PROT, ALBUMIN in the last 168 hours. No results for input(s): LIPASE, AMYLASE in the last 168 hours. No results for input(s): AMMONIA in the last 168 hours.  CBC: Recent Labs  Lab 06/29/20 0540 07/02/20 0412  WBC 3.6* 4.6  HGB 9.5* 8.5*  HCT 29.6* 25.6*  MCV 104.2* 105.3*  PLT 178 173    Cardiac Enzymes: No results for input(s): CKTOTAL, CKMB, CKMBINDEX, TROPONINI in the last 168  hours.  BNP (last 3 results) No results for input(s): BNP in the last 8760 hours.  ProBNP (last 3 results) No results for input(s): PROBNP in the last 8760 hours.  Radiological Exams: No results found.  Assessment/Plan Active Problems:   Acute on chronic respiratory failure with hypoxia (HCC)   COVID-19 virus infection   Tracheostomy status (HCC)   Pneumonia due to COVID-19 virus   Acute respiratory distress syndrome (ARDS) due to COVID-19 virus (HCC)   1. Acute on chronic respiratory failure hypoxia we will continue with T collar titrate oxygen continue pulmonary toilet 2. COVID-19 virus infection in the resolution phase we will continue to follow 3. Tracheostomy remains in place 4. Pneumonia due to COVID-19 treated slow improvement 5. ARDS patient is at baseline   I have personally seen and evaluated the patient, evaluated laboratory and imaging results, formulated the assessment and plan and placed orders. The Patient requires high complexity decision making with multiple systems involvement.  Rounds were done with the Respiratory Therapy Director and Staff therapists and discussed with nursing staff also.  Yevonne Pax, MD [facility] Pulmonary Critical Care Medicine Sleep Medicine

## 2020-07-04 ENCOUNTER — Other Ambulatory Visit (HOSPITAL_COMMUNITY): Payer: Medicare (Managed Care)

## 2020-07-04 DIAGNOSIS — J1282 Pneumonia due to coronavirus disease 2019: Secondary | ICD-10-CM | POA: Diagnosis not present

## 2020-07-04 DIAGNOSIS — U071 COVID-19: Secondary | ICD-10-CM | POA: Diagnosis not present

## 2020-07-04 DIAGNOSIS — J80 Acute respiratory distress syndrome: Secondary | ICD-10-CM | POA: Diagnosis not present

## 2020-07-04 DIAGNOSIS — J9621 Acute and chronic respiratory failure with hypoxia: Secondary | ICD-10-CM | POA: Diagnosis not present

## 2020-07-04 DIAGNOSIS — Z930 Tracheostomy status: Secondary | ICD-10-CM | POA: Diagnosis not present

## 2020-07-04 MED ORDER — IOPAMIDOL (ISOVUE-300) INJECTION 61%: 25.0000 mL | Freq: Once | INTRAVENOUS | Status: DC | PRN

## 2020-07-04 NOTE — Progress Notes (Signed)
Pulmonary Critical Care Medicine [facility] [place]   PULMONARY CRITICAL CARE SERVICE  PROGRESS NOTE  Date of Service: 07/04/2020  Emma Anderson  VVO:[id number]  DOB: May 29, 1960   DOA: 06/15/2020  Referring Physician: Carron Curie, MD  HPI: Emma Anderson is a 60 y.o. female seen for follow up of Acute on Chronic Respiratory Failure.  Patient currently is on T collar has been on now 100% FiO2 overnight saturations are fine respiratory therapy is going to try to wean the FiO2 down  Medications: Reviewed on Rounds  Physical Exam:  Vitals: Temperature 98.1 pulse 83 respiratory 20 blood pressure 98/64 saturations 96%  Ventilator Settings on T collar FiO2 100%  . General: Comfortable at this time . Eyes: Grossly normal lids, irises & conjunctiva . ENT: grossly tongue is normal . Neck: no obvious mass . Cardiovascular: S1 S2 normal no gallop . Respiratory: Scattered rhonchi expansion equal . Abdomen: soft . Skin: no rash seen on limited exam . Musculoskeletal: not rigid . Psychiatric:unable to assess . Neurologic: no seizure no involuntary movements         Lab Data:   Basic Metabolic Panel: Recent Labs  Lab 06/29/20 0540 07/02/20 0412  NA 137 139  K 3.6 4.0  CL 95* 99  CO2 30 29  GLUCOSE 94 122*  BUN 23 22  CREATININE 0.66 0.77  CALCIUM 9.9 9.9  MG 2.0 2.0  PHOS 4.5  --     ABG: Recent Labs  Lab 06/28/20 1034 06/29/20 0515  PHART 7.433 7.466*  PCO2ART 46.5 44.4  PO2ART 48.9* 47.4*  HCO3 30.7* 31.9*  O2SAT 84.6 84.5    Liver Function Tests: No results for input(s): AST, ALT, ALKPHOS, BILITOT, PROT, ALBUMIN in the last 168 hours. No results for input(s): LIPASE, AMYLASE in the last 168 hours. No results for input(s): AMMONIA in the last 168 hours.  CBC: Recent Labs  Lab 06/29/20 0540 07/02/20 0412  WBC 3.6* 4.6  HGB 9.5* 8.5*  HCT 29.6* 25.6*  MCV 104.2* 105.3*  PLT 178 173    Cardiac Enzymes: No results for input(s): CKTOTAL,  CKMB, CKMBINDEX, TROPONINI in the last 168 hours.  BNP (last 3 results) No results for input(s): BNP in the last 8760 hours.  ProBNP (last 3 results) No results for input(s): PROBNP in the last 8760 hours.  Radiological Exams: DG CHEST PORT 1 VIEW  Result Date: 07/04/2020 CLINICAL DATA:  Pneumonia EXAM: PORTABLE CHEST 1 VIEW COMPARISON:  June 29, 2020 FINDINGS: Tracheostomy catheter tip is 3.4 cm above the carina. No pneumothorax. Airspace opacity throughout the left lung, most severe in the left lower lung region, is stable. Ill-defined patchy opacity in the right base is also present. No new opacity evident. Heart is mildly enlarged, stable, with pulmonary vascularity normal. No adenopathy. No bone lesions. IMPRESSION: Tracheostomy again noted. Multifocal airspace opacity, considerably more on the left than on the right, stable. Stable cardiac prominence. Electronically Signed   By: Bretta Bang III M.D.   On: 07/04/2020 08:24    Assessment/Plan Active Problems:   Acute on chronic respiratory failure with hypoxia (HCC)   COVID-19 virus infection   Tracheostomy status (HCC)   Pneumonia due to COVID-19 virus   Acute respiratory distress syndrome (ARDS) due to COVID-19 virus (HCC)   1. Acute on chronic respiratory failure with hypoxia we will continue with trying to wean FiO2 down patient saturations are improved 2. COVID-19 virus infection in recovery 3. Tracheostomy remains in place 4. Pneumonia due to COVID-19  treated we will continue to follow 5. ARDS treated slow improvement chest x-ray showing some residual airspace disease   I have personally seen and evaluated the patient, evaluated laboratory and imaging results, formulated the assessment and plan and placed orders. The Patient requires high complexity decision making with multiple systems involvement.  Rounds were done with the Respiratory Therapy Director and Staff therapists and discussed with nursing staff  also.  Yevonne Pax, MD [facility] Pulmonary Critical Care Medicine Sleep Medicine

## 2020-07-05 DIAGNOSIS — J9621 Acute and chronic respiratory failure with hypoxia: Secondary | ICD-10-CM | POA: Diagnosis not present

## 2020-07-05 DIAGNOSIS — J1282 Pneumonia due to coronavirus disease 2019: Secondary | ICD-10-CM | POA: Diagnosis not present

## 2020-07-05 DIAGNOSIS — U071 COVID-19: Secondary | ICD-10-CM | POA: Diagnosis not present

## 2020-07-05 DIAGNOSIS — J80 Acute respiratory distress syndrome: Secondary | ICD-10-CM | POA: Diagnosis not present

## 2020-07-05 DIAGNOSIS — Z930 Tracheostomy status: Secondary | ICD-10-CM | POA: Diagnosis not present

## 2020-07-05 LAB — CBC
HCT: 32.8 % — ABNORMAL LOW (ref 36.0–46.0)
Hemoglobin: 9.9 g/dL — ABNORMAL LOW (ref 12.0–15.0)
MCH: 33.4 pg (ref 26.0–34.0)
MCHC: 30.2 g/dL (ref 30.0–36.0)
MCV: 110.8 fL — ABNORMAL HIGH (ref 80.0–100.0)
Platelets: 190 10*3/uL (ref 150–400)
RBC: 2.96 MIL/uL — ABNORMAL LOW (ref 3.87–5.11)
RDW: 16 % — ABNORMAL HIGH (ref 11.5–15.5)
WBC: 5.7 10*3/uL (ref 4.0–10.5)
nRBC: 0 % (ref 0.0–0.2)

## 2020-07-05 LAB — RENAL FUNCTION PANEL
Albumin: 3.1 g/dL — ABNORMAL LOW (ref 3.5–5.0)
Anion gap: 15 (ref 5–15)
BUN: 22 mg/dL (ref 8–23)
CO2: 23 mmol/L (ref 22–32)
Calcium: 9.8 mg/dL (ref 8.9–10.3)
Chloride: 98 mmol/L (ref 98–111)
Creatinine, Ser: 0.77 mg/dL (ref 0.44–1.00)
GFR, Estimated: >60 mL/min (ref >60)
Glucose, Bld: 116 mg/dL — ABNORMAL HIGH (ref 70–99)
Phosphorus: 4.7 mg/dL — ABNORMAL HIGH (ref 2.5–4.6)
Potassium: 4.4 mmol/L (ref 3.5–5.1)
Sodium: 136 mmol/L (ref 135–145)

## 2020-07-05 LAB — MAGNESIUM: Magnesium: 2.1 mg/dL (ref 1.7–2.4)

## 2020-07-05 NOTE — Progress Notes (Signed)
Pulmonary Critical Care Medicine [facility] [place]   PULMONARY CRITICAL CARE SERVICE  PROGRESS NOTE  Date of Service: 07/05/2020  Emma Anderson  WGY:[id number]  DOB: Mar 03, 1961   DOA: 06/15/2020  Referring Physician: Carron Curie, MD  HPI: Emma Anderson is a 60 y.o. female seen for follow up of Acute on Chronic Respiratory Failure.  Currently is on T collar has been on 50% FiO2 chest x-ray revealing what appears to be a worsening left-sided pneumonia  Medications: Reviewed on Rounds  Physical Exam:  Vitals: Temperature 98.1 pulse 74 respiratory 24 blood pressure is 121/76 saturations 97%  Ventilator Settings on T collar FiO2 of 50%  . General: Comfortable at this time . Eyes: Grossly normal lids, irises & conjunctiva . ENT: grossly tongue is normal . Neck: no obvious mass . Cardiovascular: S1 S2 normal no gallop . Respiratory: Coarse breath sounds with few scattered rhonchi . Abdomen: soft . Skin: no rash seen on limited exam . Musculoskeletal: not rigid . Psychiatric:unable to assess . Neurologic: no seizure no involuntary movements         Lab Data:   Basic Metabolic Panel: Recent Labs  Lab 06/29/20 0540 07/02/20 0412 07/05/20 0326  NA 137 139 136  K 3.6 4.0 4.4  CL 95* 99 98  CO2 30 29 23   GLUCOSE 94 122* 116*  BUN 23 22 22   CREATININE 0.66 0.77 0.77  CALCIUM 9.9 9.9 9.8  MG 2.0 2.0 2.1  PHOS 4.5  --  4.7*    ABG: Recent Labs  Lab 06/28/20 1034 06/29/20 0515  PHART 7.433 7.466*  PCO2ART 46.5 44.4  PO2ART 48.9* 47.4*  HCO3 30.7* 31.9*  O2SAT 84.6 84.5    Liver Function Tests: Recent Labs  Lab 07/05/20 0326  ALBUMIN 3.1*   No results for input(s): LIPASE, AMYLASE in the last 168 hours. No results for input(s): AMMONIA in the last 168 hours.  CBC: Recent Labs  Lab 06/29/20 0540 07/02/20 0412 07/05/20 0326  WBC 3.6* 4.6 5.7  HGB 9.5* 8.5* 9.9*  HCT 29.6* 25.6* 32.8*  MCV 104.2* 105.3* 110.8*  PLT 178 173 190     Cardiac Enzymes: No results for input(s): CKTOTAL, CKMB, CKMBINDEX, TROPONINI in the last 168 hours.  BNP (last 3 results) No results for input(s): BNP in the last 8760 hours.  ProBNP (last 3 results) No results for input(s): PROBNP in the last 8760 hours.  Radiological Exams: DG Abd 1 View  Result Date: 07/04/2020 CLINICAL DATA:  Follow-up peg placement. EXAM: ABDOMEN - 1 VIEW COMPARISON:  None. FINDINGS: Gastrostomy tube in place entering the mid body of the stomach along the greater curvature. Injected contrast is present within the stomach, passing into the duodenum. No evidence of extravasation. IMPRESSION: Gastrostomy tube well positioned. No evidence of extravasation or obstruction. Electronically Signed   By: 09/02/20 M.D.   On: 07/04/2020 14:57   DG CHEST PORT 1 VIEW  Result Date: 07/04/2020 CLINICAL DATA:  Pneumonia EXAM: PORTABLE CHEST 1 VIEW COMPARISON:  June 29, 2020 FINDINGS: Tracheostomy catheter tip is 3.4 cm above the carina. No pneumothorax. Airspace opacity throughout the left lung, most severe in the left lower lung region, is stable. Ill-defined patchy opacity in the right base is also present. No new opacity evident. Heart is mildly enlarged, stable, with pulmonary vascularity normal. No adenopathy. No bone lesions. IMPRESSION: Tracheostomy again noted. Multifocal airspace opacity, considerably more on the left than on the right, stable. Stable cardiac prominence. Electronically Signed   By:  Bretta Bang III M.D.   On: 07/04/2020 08:24    Assessment/Plan Active Problems:   Acute on chronic respiratory failure with hypoxia (HCC)   COVID-19 virus infection   Tracheostomy status (HCC)   Pneumonia due to COVID-19 virus   Acute respiratory distress syndrome (ARDS) due to COVID-19 virus (HCC)   1. Acute on chronic respiratory failure hypoxia we will continue with T collar trials titrate oxygen continue pulmonary toilet. 2. COVID-19 virus infection in  recovery phase we will continue to follow 3. Pneumonia due to COVID-19 treated however there appears to be a left-sided increasing infiltrates noted as discussed 4. ARDS treated slowly improving 5. Tracheostomy right now remains in place   I have personally seen and evaluated the patient, evaluated laboratory and imaging results, formulated the assessment and plan and placed orders. The Patient requires high complexity decision making with multiple systems involvement.  Rounds were done with the Respiratory Therapy Director and Staff therapists and discussed with nursing staff also.  Yevonne Pax, MD [facility] Pulmonary Critical Care Medicine Sleep Medicine

## 2020-07-06 DIAGNOSIS — J80 Acute respiratory distress syndrome: Secondary | ICD-10-CM | POA: Diagnosis not present

## 2020-07-06 DIAGNOSIS — J1282 Pneumonia due to coronavirus disease 2019: Secondary | ICD-10-CM | POA: Diagnosis not present

## 2020-07-06 DIAGNOSIS — U071 COVID-19: Secondary | ICD-10-CM | POA: Diagnosis not present

## 2020-07-06 DIAGNOSIS — Z930 Tracheostomy status: Secondary | ICD-10-CM | POA: Diagnosis not present

## 2020-07-06 DIAGNOSIS — J9621 Acute and chronic respiratory failure with hypoxia: Secondary | ICD-10-CM | POA: Diagnosis not present

## 2020-07-06 NOTE — Progress Notes (Signed)
Pulmonary Critical Care Medicine [facility] [place]   PULMONARY CRITICAL CARE SERVICE  PROGRESS NOTE  Date of Service: 07/06/2020  Emma Anderson  JJO:[id number]  DOB: 13-Aug-1960   DOA: 06/15/2020  Referring Physician: Carron Curie, MD  HPI: Emma Anderson is a 60 y.o. female seen for follow up of Acute on Chronic Respiratory Failure.  Patient currently is on T collar on 40% FiO2 good saturations are noted.  Medications: Reviewed on Rounds  Physical Exam:  Vitals: Temperature is 97.0 pulse 66 respiratory 20 blood pressure is 90/62 saturations 100%  Ventilator Settings on T collar with an FiO2 of 40%  . General: Comfortable at this time . Eyes: Grossly normal lids, irises & conjunctiva . ENT: grossly tongue is normal . Neck: no obvious mass . Cardiovascular: S1 S2 normal no gallop . Respiratory: Scattered rhonchi expansion is equal . Abdomen: soft . Skin: no rash seen on limited exam . Musculoskeletal: not rigid . Psychiatric:unable to assess . Neurologic: no seizure no involuntary movements         Lab Data:   Basic Metabolic Panel: Recent Labs  Lab 07/02/20 0412 07/05/20 0326  NA 139 136  K 4.0 4.4  CL 99 98  CO2 29 23  GLUCOSE 122* 116*  BUN 22 22  CREATININE 0.77 0.77  CALCIUM 9.9 9.8  MG 2.0 2.1  PHOS  --  4.7*    ABG: No results for input(s): PHART, PCO2ART, PO2ART, HCO3, O2SAT in the last 168 hours.  Liver Function Tests: Recent Labs  Lab 07/05/20 0326  ALBUMIN 3.1*   No results for input(s): LIPASE, AMYLASE in the last 168 hours. No results for input(s): AMMONIA in the last 168 hours.  CBC: Recent Labs  Lab 07/02/20 0412 07/05/20 0326  WBC 4.6 5.7  HGB 8.5* 9.9*  HCT 25.6* 32.8*  MCV 105.3* 110.8*  PLT 173 190    Cardiac Enzymes: No results for input(s): CKTOTAL, CKMB, CKMBINDEX, TROPONINI in the last 168 hours.  BNP (last 3 results) No results for input(s): BNP in the last 8760 hours.  ProBNP (last 3 results) No  results for input(s): PROBNP in the last 8760 hours.  Radiological Exams: DG Abd 1 View  Result Date: 07/04/2020 CLINICAL DATA:  Follow-up peg placement. EXAM: ABDOMEN - 1 VIEW COMPARISON:  None. FINDINGS: Gastrostomy tube in place entering the mid body of the stomach along the greater curvature. Injected contrast is present within the stomach, passing into the duodenum. No evidence of extravasation. IMPRESSION: Gastrostomy tube well positioned. No evidence of extravasation or obstruction. Electronically Signed   By: Paulina Fusi M.D.   On: 07/04/2020 14:57    Assessment/Plan Active Problems:   Acute on chronic respiratory failure with hypoxia (HCC)   COVID-19 virus infection   Tracheostomy status (HCC)   Pneumonia due to COVID-19 virus   Acute respiratory distress syndrome (ARDS) due to COVID-19 virus (HCC)   1. Acute on chronic respiratory failure with hypoxia continue with the T-piece patient is on 40% FiO2 2. COVID-19 virus infection recovery we will continue to follow 3. Pneumonia due to COVID-19 treated slow improvement 4. ARDS slow improvement we will continue to follow   I have personally seen and evaluated the patient, evaluated laboratory and imaging results, formulated the assessment and plan and placed orders. The Patient requires high complexity decision making with multiple systems involvement.  Rounds were done with the Respiratory Therapy Director and Staff therapists and discussed with nursing staff also.  Yevonne Pax, MD  [facility] Pulmonary Critical Care Medicine Sleep Medicine

## 2020-07-08 ENCOUNTER — Other Ambulatory Visit (HOSPITAL_COMMUNITY): Payer: Medicare (Managed Care)

## 2020-07-08 DIAGNOSIS — J9621 Acute and chronic respiratory failure with hypoxia: Secondary | ICD-10-CM | POA: Diagnosis not present

## 2020-07-08 DIAGNOSIS — J80 Acute respiratory distress syndrome: Secondary | ICD-10-CM | POA: Diagnosis not present

## 2020-07-08 DIAGNOSIS — J1282 Pneumonia due to coronavirus disease 2019: Secondary | ICD-10-CM | POA: Diagnosis not present

## 2020-07-08 DIAGNOSIS — U071 COVID-19: Secondary | ICD-10-CM | POA: Diagnosis not present

## 2020-07-08 DIAGNOSIS — Z930 Tracheostomy status: Secondary | ICD-10-CM | POA: Diagnosis not present

## 2020-07-08 LAB — CULTURE, RESPIRATORY W GRAM STAIN: Culture: NORMAL

## 2020-07-08 NOTE — Progress Notes (Signed)
Pulmonary Critical Care Medicine [facility] [place]   PULMONARY CRITICAL CARE SERVICE  PROGRESS NOTE  Date of Service: 07/08/2020  Emma Anderson  ZOX:[id number]  DOB: 03/29/1961   DOA: 06/15/2020  Referring Physician: Carron Curie, MD  HPI: Emma Anderson is a 60 y.o. female seen for follow up of Acute on Chronic Respiratory Failure.  Patient at this time is on T collar has been on 40% FiO2 using PMV should be ready for capping  Medications: Reviewed on Rounds  Physical Exam:  Vitals: Temperature is 97.8 pulse 88 respiratory rate 30 blood pressure is 122/66 saturations 94%  Ventilator Settings on T collar FiO2 of 40% using PMV  . General: Comfortable at this time . Eyes: Grossly normal lids, irises & conjunctiva . ENT: grossly tongue is normal . Neck: no obvious mass . Cardiovascular: S1 S2 normal no gallop . Respiratory: Scattered rhonchi very coarse breath sounds . Abdomen: soft . Skin: no rash seen on limited exam . Musculoskeletal: not rigid . Psychiatric:unable to assess . Neurologic: no seizure no involuntary movements         Lab Data:   Basic Metabolic Panel: Recent Labs  Lab 07/02/20 0412 07/05/20 0326  NA 139 136  K 4.0 4.4  CL 99 98  CO2 29 23  GLUCOSE 122* 116*  BUN 22 22  CREATININE 0.77 0.77  CALCIUM 9.9 9.8  MG 2.0 2.1  PHOS  --  4.7*    ABG: No results for input(s): PHART, PCO2ART, PO2ART, HCO3, O2SAT in the last 168 hours.  Liver Function Tests: Recent Labs  Lab 07/05/20 0326  ALBUMIN 3.1*   No results for input(s): LIPASE, AMYLASE in the last 168 hours. No results for input(s): AMMONIA in the last 168 hours.  CBC: Recent Labs  Lab 07/02/20 0412 07/05/20 0326  WBC 4.6 5.7  HGB 8.5* 9.9*  HCT 25.6* 32.8*  MCV 105.3* 110.8*  PLT 173 190    Cardiac Enzymes: No results for input(s): CKTOTAL, CKMB, CKMBINDEX, TROPONINI in the last 168 hours.  BNP (last 3 results) No results for input(s): BNP in the last 8760  hours.  ProBNP (last 3 results) No results for input(s): PROBNP in the last 8760 hours.  Radiological Exams: No results found.  Assessment/Plan Active Problems:   Acute on chronic respiratory failure with hypoxia (HCC)   COVID-19 virus infection   Tracheostomy status (HCC)   Pneumonia due to COVID-19 virus   Acute respiratory distress syndrome (ARDS) due to COVID-19 virus (HCC)   1. Acute on chronic respiratory failure hypoxia we will continue with T collar trials PMV should be advanced to capping 2. COVID-19 virus infection recovery 3. Tracheostomy will advance to capping trials 4. Pneumonia due to COVID-19 treated 5. ARDS treated slowly improving   I have personally seen and evaluated the patient, evaluated laboratory and imaging results, formulated the assessment and plan and placed orders. The Patient requires high complexity decision making with multiple systems involvement.  Rounds were done with the Respiratory Therapy Director and Staff therapists and discussed with nursing staff also.  Yevonne Pax, MD [facility] Pulmonary Critical Care Medicine Sleep Medicine

## 2020-07-09 ENCOUNTER — Other Ambulatory Visit (HOSPITAL_COMMUNITY): Payer: Medicare (Managed Care)

## 2020-07-09 DIAGNOSIS — J80 Acute respiratory distress syndrome: Secondary | ICD-10-CM | POA: Diagnosis not present

## 2020-07-09 DIAGNOSIS — J1282 Pneumonia due to coronavirus disease 2019: Secondary | ICD-10-CM | POA: Diagnosis not present

## 2020-07-09 DIAGNOSIS — U071 COVID-19: Secondary | ICD-10-CM | POA: Diagnosis not present

## 2020-07-09 DIAGNOSIS — J9621 Acute and chronic respiratory failure with hypoxia: Secondary | ICD-10-CM | POA: Diagnosis not present

## 2020-07-09 DIAGNOSIS — Z930 Tracheostomy status: Secondary | ICD-10-CM | POA: Diagnosis not present

## 2020-07-09 LAB — CBC
HCT: 24.7 % — ABNORMAL LOW (ref 36.0–46.0)
Hemoglobin: 8.2 g/dL — ABNORMAL LOW (ref 12.0–15.0)
MCH: 34.9 pg — ABNORMAL HIGH (ref 26.0–34.0)
MCHC: 33.2 g/dL (ref 30.0–36.0)
MCV: 105.1 fL — ABNORMAL HIGH (ref 80.0–100.0)
Platelets: 268 10*3/uL (ref 150–400)
RBC: 2.35 MIL/uL — ABNORMAL LOW (ref 3.87–5.11)
RDW: 15.9 % — ABNORMAL HIGH (ref 11.5–15.5)
WBC: 4.1 10*3/uL (ref 4.0–10.5)
nRBC: 0 % (ref 0.0–0.2)

## 2020-07-09 LAB — BLOOD GAS, ARTERIAL
Acid-Base Excess: 5.4 mmol/L — ABNORMAL HIGH (ref 0.0–2.0)
Bicarbonate: 29.5 mmol/L — ABNORMAL HIGH (ref 20.0–28.0)
FIO2: 80
O2 Saturation: 85.6 %
Patient temperature: 37
pCO2 arterial: 43.8 mmHg (ref 32.0–48.0)
pH, Arterial: 7.443 (ref 7.350–7.450)
pO2, Arterial: 50 mmHg — ABNORMAL LOW (ref 83.0–108.0)

## 2020-07-09 LAB — BASIC METABOLIC PANEL
Anion gap: 10 (ref 5–15)
BUN: 13 mg/dL (ref 8–23)
CO2: 29 mmol/L (ref 22–32)
Calcium: 9.5 mg/dL (ref 8.9–10.3)
Chloride: 98 mmol/L (ref 98–111)
Creatinine, Ser: 0.7 mg/dL (ref 0.44–1.00)
GFR, Estimated: >60 mL/min (ref >60)
Glucose, Bld: 143 mg/dL — ABNORMAL HIGH (ref 70–99)
Potassium: 4.2 mmol/L (ref 3.5–5.1)
Sodium: 137 mmol/L (ref 135–145)

## 2020-07-09 LAB — MAGNESIUM: Magnesium: 1.9 mg/dL (ref 1.7–2.4)

## 2020-07-09 NOTE — Progress Notes (Signed)
Pulmonary Critical Care Medicine [facility] [place]   PULMONARY CRITICAL CARE SERVICE  PROGRESS NOTE  Date of Service: 07/09/2020  Emma Anderson  [id number]  DOB: [birth year]   DOA: 06/15/2020  Referring Physician: Carron Curie, MD  HPI: Emma Anderson is a 60 y.o. female seen for follow up of Acute on Chronic Respiratory Failure.  Patient is on T collar has been having continued issues with the oxygenation FiO2 was turned up to 80%..  Chest x-ray not showing much in the way of improvement  Medications: Reviewed on Rounds  Physical Exam:  Vitals: Temperature is 98.9 pulse 104 respiratory 25 blood pressure is 135/70 saturations 98%  Ventilator Settings on T collar  . General: Comfortable at this time . Eyes: Grossly normal lids, irises & conjunctiva . ENT: grossly tongue is normal . Neck: no obvious mass . Cardiovascular: S1 S2 normal no gallop . Respiratory: Scattered rhonchi expansion is equal. . Abdomen: soft . Skin: no rash seen on limited exam . Musculoskeletal: not rigid . Psychiatric:unable to assess . Neurologic: no seizure no involuntary movements         Lab Data:   Basic Metabolic Panel: Recent Labs  Lab 07/05/20 0326  NA 136  K 4.4  CL 98  CO2 23  GLUCOSE 116*  BUN 22  CREATININE 0.77  CALCIUM 9.8  MG 2.1  PHOS 4.7*    ABG: Recent Labs  Lab 07/09/20 0541  PHART 7.443  PCO2ART 43.8  PO2ART 50.0*  HCO3 29.5*  O2SAT 85.6    Liver Function Tests: Recent Labs  Lab 07/05/20 0326  ALBUMIN 3.1*   No results for input(s): LIPASE, AMYLASE in the last 168 hours. No results for input(s): AMMONIA in the last 168 hours.  CBC: Recent Labs  Lab 07/05/20 0326  WBC 5.7  HGB 9.9*  HCT 32.8*  MCV 110.8*  PLT 190    Cardiac Enzymes: No results for input(s): CKTOTAL, CKMB, CKMBINDEX, TROPONINI in the last 168 hours.  BNP (last 3 results) No results for input(s): BNP in the last 8760 hours.  ProBNP (last 3 results) No  results for input(s): PROBNP in the last 8760 hours.  Radiological Exams: DG Chest Port 1 View  Result Date: 07/08/2020 CLINICAL DATA:  Pneumonia EXAM: PORTABLE CHEST 1 VIEW COMPARISON:  July 04, 2020 FINDINGS: Tracheostomy catheter present with tip 5.2 cm above the carina. No pneumothorax. Extensive airspace opacity throughout the left lung, most severe in the left lower lung region is stable. There is ill-defined opacity in the right base as well. No new opacity evident. Heart is mildly enlarged with pulmonary vascularity normal, stable. No evident adenopathy. No bone lesions. IMPRESSION: Multifocal airspace opacity, considerably more severe on the left than on the right, stable. No new opacity evident. Stable cardiac silhouette. Tracheostomy present. Electronically Signed   By: Bretta Bang III M.D.   On: 07/08/2020 16:12    Assessment/Plan Active Problems:   Acute on chronic respiratory failure with hypoxia (HCC)   COVID-19 virus infection   Tracheostomy status (HCC)   Pneumonia due to COVID-19 virus   Acute respiratory distress syndrome (ARDS) due to COVID-19 virus (HCC)   1. Acute on chronic respiratory failure with hypoxia patient's oxygen requirements continue to go up and the patient may need to go back on the ventilator.  Spoke with her at the bedside.  May be a temporary issue but with should probably place her back on the ventilator she is agreeable initially she was not agreeable and  we confirmed it with 2 different providers that she is okay to go back on the ventilator 2. COVID-19 virus infection in recovery we will continue to monitor closely. 3. Pneumonia due to COVID-19 treated we will continue to follow 4. Tracheostomy remains in place 5. ARDS at baseline we will continue to monitor closely   I have personally seen and evaluated the patient, evaluated laboratory and imaging results, formulated the assessment and plan and placed orders. The Patient requires high  complexity decision making with multiple systems involvement.  Rounds were done with the Respiratory Therapy Director and Staff therapists and discussed with nursing staff also.  Yevonne Pax, MD [facility] Pulmonary Critical Care Medicine Sleep Medicine

## 2020-07-10 DIAGNOSIS — J9621 Acute and chronic respiratory failure with hypoxia: Secondary | ICD-10-CM | POA: Diagnosis not present

## 2020-07-10 DIAGNOSIS — Z930 Tracheostomy status: Secondary | ICD-10-CM | POA: Diagnosis not present

## 2020-07-10 DIAGNOSIS — J1282 Pneumonia due to coronavirus disease 2019: Secondary | ICD-10-CM | POA: Diagnosis not present

## 2020-07-10 DIAGNOSIS — U071 COVID-19: Secondary | ICD-10-CM | POA: Diagnosis not present

## 2020-07-10 DIAGNOSIS — J80 Acute respiratory distress syndrome: Secondary | ICD-10-CM | POA: Diagnosis not present

## 2020-07-10 LAB — BLOOD GAS, ARTERIAL
Acid-Base Excess: 4.2 mmol/L — ABNORMAL HIGH (ref 0.0–2.0)
Bicarbonate: 28 mmol/L (ref 20.0–28.0)
FIO2: 60
O2 Saturation: 93.2 %
Patient temperature: 35.7
pCO2 arterial: 37.9 mmHg (ref 32.0–48.0)
pH, Arterial: 7.475 — ABNORMAL HIGH (ref 7.350–7.450)
pO2, Arterial: 59.1 mmHg — ABNORMAL LOW (ref 83.0–108.0)

## 2020-07-10 NOTE — Progress Notes (Signed)
Pulmonary Critical Care Medicine [facility] [place]   PULMONARY CRITICAL CARE SERVICE  PROGRESS NOTE  Date of Service: 07/10/2020  Emma Anderson  UXN:[id number]  DOB: Dec 03, 1960   DOA: 06/15/2020  Referring Physician: Carron Curie, MD  HPI: Emma Anderson is a 60 y.o. female seen for follow up of Acute on Chronic Respiratory Failure.  Patient is on pressure support had a CT scan done yesterday shows diffuse bilateral infiltrates there is areas which may very well represent some organizing pneumonia also.  Medications: Reviewed on Rounds  Physical Exam:  Vitals: Temperature 98.1 pulse 67 respiratory rate is 38 blood pressure is 97/58 saturations 97%  Ventilator Settings on pressure support FiO2 is 50% pressure of 16/5  . General: Comfortable at this time . Eyes: Grossly normal lids, irises & conjunctiva . ENT: grossly tongue is normal . Neck: no obvious mass . Cardiovascular: S1 S2 normal no gallop . Respiratory: Scattered rhonchi expansion is equal . Abdomen: soft . Skin: no rash seen on limited exam . Musculoskeletal: not rigid . Psychiatric:unable to assess . Neurologic: no seizure no involuntary movements         Lab Data:   Basic Metabolic Panel: Recent Labs  Lab 07/05/20 0326 07/09/20 1326  NA 136 137  K 4.4 4.2  CL 98 98  CO2 23 29  GLUCOSE 116* 143*  BUN 22 13  CREATININE 0.77 0.70  CALCIUM 9.8 9.5  MG 2.1 1.9  PHOS 4.7*  --     ABG: Recent Labs  Lab 07/09/20 0541  PHART 7.443  PCO2ART 43.8  PO2ART 50.0*  HCO3 29.5*  O2SAT 85.6    Liver Function Tests: Recent Labs  Lab 07/05/20 0326  ALBUMIN 3.1*   No results for input(s): LIPASE, AMYLASE in the last 168 hours. No results for input(s): AMMONIA in the last 168 hours.  CBC: Recent Labs  Lab 07/05/20 0326 07/09/20 1326  WBC 5.7 4.1  HGB 9.9* 8.2*  HCT 32.8* 24.7*  MCV 110.8* 105.1*  PLT 190 268    Cardiac Enzymes: No results for input(s): CKTOTAL, CKMB,  CKMBINDEX, TROPONINI in the last 168 hours.  BNP (last 3 results) No results for input(s): BNP in the last 8760 hours.  ProBNP (last 3 results) No results for input(s): PROBNP in the last 8760 hours.  Radiological Exams: CT CHEST WO CONTRAST  Result Date: 07/09/2020 CLINICAL DATA:  Respiratory failure. EXAM: CT CHEST WITHOUT CONTRAST TECHNIQUE: Multidetector CT imaging of the chest was performed following the standard protocol without IV contrast. COMPARISON:  July 08, 2020. FINDINGS: Cardiovascular: Atherosclerosis of thoracic aorta is noted without aneurysm formation. Mild cardiomegaly is noted. No pericardial effusion is noted. Coronary artery calcifications are noted. Mediastinum/Nodes: Tracheostomy tube is in good position. Thyroid gland is unremarkable. Esophagus is unremarkable. Mildly enlarged lymph nodes are noted which most likely are inflammatory or reactive in etiology. Lungs/Pleura: No pneumothorax or pleural effusion is noted. Diffuse bilateral lung opacities are noted most consistent with multifocal pneumonia. Upper Abdomen: No acute abnormality. Musculoskeletal: Old left lower posterior rib fractures are noted. No acute osseous abnormality is noted IMPRESSION: 1. Diffuse bilateral lung opacities are noted most consistent with multifocal pneumonia. 2. Coronary artery calcifications are noted suggesting coronary artery disease. 3. Tracheostomy tube is in good position. 4. Mildly enlarged mediastinal lymph nodes are noted which most likely are inflammatory or reactive in etiology. 5. Aortic atherosclerosis. Aortic Atherosclerosis (ICD10-I70.0). Electronically Signed   By: Lupita Raider M.D.   On: 07/09/2020 13:01  DG Chest Port 1 View  Result Date: 07/08/2020 CLINICAL DATA:  Pneumonia EXAM: PORTABLE CHEST 1 VIEW COMPARISON:  July 04, 2020 FINDINGS: Tracheostomy catheter present with tip 5.2 cm above the carina. No pneumothorax. Extensive airspace opacity throughout the left  lung, most severe in the left lower lung region is stable. There is ill-defined opacity in the right base as well. No new opacity evident. Heart is mildly enlarged with pulmonary vascularity normal, stable. No evident adenopathy. No bone lesions. IMPRESSION: Multifocal airspace opacity, considerably more severe on the left than on the right, stable. No new opacity evident. Stable cardiac silhouette. Tracheostomy present. Electronically Signed   By: Bretta Bang III M.D.   On: 07/08/2020 16:12    Assessment/Plan Active Problems:   Acute on chronic respiratory failure with hypoxia (HCC)   COVID-19 virus infection   Tracheostomy status (HCC)   Pneumonia due to COVID-19 virus   Acute respiratory distress syndrome (ARDS) due to COVID-19 virus (HCC)   1. Acute on chronic respiratory failure hypoxia we will continue with pressure support titrate oxygen as tolerated continue pulmonary toilet 2. COVID-19 virus infection recovery we will continue to follow 3. Pneumonia due to COVID-19 treated 4. ARDS treated slow improved   I have personally seen and evaluated the patient, evaluated laboratory and imaging results, formulated the assessment and plan and placed orders. The Patient requires high complexity decision making with multiple systems involvement.  Rounds were done with the Respiratory Therapy Director and Staff therapists and discussed with nursing staff also.  Yevonne Pax, MD [facility] Pulmonary Critical Care Medicine Sleep Medicine

## 2020-07-11 DIAGNOSIS — U071 COVID-19: Secondary | ICD-10-CM | POA: Diagnosis not present

## 2020-07-11 DIAGNOSIS — J80 Acute respiratory distress syndrome: Secondary | ICD-10-CM | POA: Diagnosis not present

## 2020-07-11 DIAGNOSIS — J9621 Acute and chronic respiratory failure with hypoxia: Secondary | ICD-10-CM | POA: Diagnosis not present

## 2020-07-11 DIAGNOSIS — Z930 Tracheostomy status: Secondary | ICD-10-CM | POA: Diagnosis not present

## 2020-07-11 DIAGNOSIS — J1282 Pneumonia due to coronavirus disease 2019: Secondary | ICD-10-CM | POA: Diagnosis not present

## 2020-07-11 LAB — CBC
HCT: 24.1 % — ABNORMAL LOW (ref 36.0–46.0)
Hemoglobin: 7.6 g/dL — ABNORMAL LOW (ref 12.0–15.0)
MCH: 33.2 pg (ref 26.0–34.0)
MCHC: 31.5 g/dL (ref 30.0–36.0)
MCV: 105.2 fL — ABNORMAL HIGH (ref 80.0–100.0)
Platelets: 213 10*3/uL (ref 150–400)
RBC: 2.29 MIL/uL — ABNORMAL LOW (ref 3.87–5.11)
RDW: 15.2 % (ref 11.5–15.5)
WBC: 4.5 10*3/uL (ref 4.0–10.5)
nRBC: 0 % (ref 0.0–0.2)

## 2020-07-11 NOTE — Progress Notes (Signed)
Pulmonary Critical Care Medicine [facility] [place]   PULMONARY CRITICAL CARE SERVICE  PROGRESS NOTE  Date of Service: 07/11/2020  Emma Anderson  [id number]  DOB: Dec 17, 1960   DOA: 06/15/2020  Referring Physician: Carron Curie, MD  HPI: Emma Anderson is a 60 y.o. female seen for follow up of Acute on Chronic Respiratory Failure.  Patient currently is on the ventilator and full support has been on pressure assist control currently is requiring 70% FiO2  Medications: Reviewed on Rounds  Physical Exam:  Vitals: Temperature is 98.9 pulse 104 respiratory rate is 20 blood pressure is 108/72 saturations 100%  Ventilator Settings on pressure assist control FiO2 70% IP 18 PEEP 7  . General: Comfortable at this time . Eyes: Grossly normal lids, irises & conjunctiva . ENT: grossly tongue is normal . Neck: no obvious mass . Cardiovascular: S1 S2 normal no gallop . Respiratory: Scattered rhonchi expansion is equal . Abdomen: soft . Skin: no rash seen on limited exam . Musculoskeletal: not rigid . Psychiatric:unable to assess . Neurologic: no seizure no involuntary movements         Lab Data:   Basic Metabolic Panel: Recent Labs  Lab 07/05/20 0326 07/09/20 1326  NA 136 137  K 4.4 4.2  CL 98 98  CO2 23 29  GLUCOSE 116* 143*  BUN 22 13  CREATININE 0.77 0.70  CALCIUM 9.8 9.5  MG 2.1 1.9  PHOS 4.7*  --     ABG: Recent Labs  Lab 07/09/20 0541 07/10/20 1150  PHART 7.443 7.475*  PCO2ART 43.8 37.9  PO2ART 50.0* 59.1*  HCO3 29.5* 28.0  O2SAT 85.6 93.2    Liver Function Tests: Recent Labs  Lab 07/05/20 0326  ALBUMIN 3.1*   No results for input(s): LIPASE, AMYLASE in the last 168 hours. No results for input(s): AMMONIA in the last 168 hours.  CBC: Recent Labs  Lab 07/05/20 0326 07/09/20 1326 07/11/20 0515  WBC 5.7 4.1 4.5  HGB 9.9* 8.2* 7.6*  HCT 32.8* 24.7* 24.1*  MCV 110.8* 105.1* 105.2*  PLT 190 268 213    Cardiac Enzymes: No  results for input(s): CKTOTAL, CKMB, CKMBINDEX, TROPONINI in the last 168 hours.  BNP (last 3 results) No results for input(s): BNP in the last 8760 hours.  ProBNP (last 3 results) No results for input(s): PROBNP in the last 8760 hours.  Radiological Exams: No results found.  Assessment/Plan Active Problems:   Acute on chronic respiratory failure with hypoxia (HCC)   COVID-19 virus infection   Tracheostomy status (HCC)   Pneumonia due to COVID-19 virus   Acute respiratory distress syndrome (ARDS) due to COVID-19 virus (HCC)   1. Acute on chronic respiratory failure hypoxia we will continue with weaning on T-piece.  The patient will be attempted on PMV 2. COVID-19 virus infection in recovery 3. Tracheostomy remains in place 4. Pneumonia due to COVID-19 treated 5. ARDS continue with supportive care   I have personally seen and evaluated the patient, evaluated laboratory and imaging results, formulated the assessment and plan and placed orders. The Patient requires high complexity decision making with multiple systems involvement.  Rounds were done with the Respiratory Therapy Director and Staff therapists and discussed with nursing staff also.  Yevonne Pax, MD [facility] Pulmonary Critical Care Medicine Sleep Medicine

## 2020-07-12 DIAGNOSIS — Z930 Tracheostomy status: Secondary | ICD-10-CM | POA: Diagnosis not present

## 2020-07-12 DIAGNOSIS — U071 COVID-19: Secondary | ICD-10-CM | POA: Diagnosis not present

## 2020-07-12 DIAGNOSIS — J9621 Acute and chronic respiratory failure with hypoxia: Secondary | ICD-10-CM | POA: Diagnosis not present

## 2020-07-12 DIAGNOSIS — J1282 Pneumonia due to coronavirus disease 2019: Secondary | ICD-10-CM | POA: Diagnosis not present

## 2020-07-12 DIAGNOSIS — J80 Acute respiratory distress syndrome: Secondary | ICD-10-CM | POA: Diagnosis not present

## 2020-07-12 LAB — CULTURE, RESPIRATORY W GRAM STAIN: Culture: NORMAL

## 2020-07-12 NOTE — Progress Notes (Signed)
Pulmonary Critical Care Medicine [facility] [place]   PULMONARY CRITICAL CARE SERVICE  PROGRESS NOTE  Date of Service: 07/12/2020  Emma Anderson  QJJ:[id number]  DOB: 12-21-1960   DOA: 06/15/2020  Referring Physician: Carron Curie, MD  HPI: Emma Anderson is a 60 y.o. female seen for follow up of Acute on Chronic Respiratory Failure.  Patient at this time is on full support on the ventilator.  Has not been tolerating weaning remains on pressure control mode on FiO2 of 70%  Medications: Reviewed on Rounds  Physical Exam:  Vitals: Temperature is 98.1 pulse 81 respiratory 27 blood pressure is 144/77 saturations 96%  Ventilator Settings on pressure assist control FiO2 is 70% IP 18 PEEP 7  . General: Comfortable at this time . Eyes: Grossly normal lids, irises & conjunctiva . ENT: grossly tongue is normal . Neck: no obvious mass . Cardiovascular: S1 S2 normal no gallop . Respiratory: Scattered rhonchi expansion is equal . Abdomen: soft . Skin: no rash seen on limited exam . Musculoskeletal: not rigid . Psychiatric:unable to assess . Neurologic: no seizure no involuntary movements         Lab Data:   Basic Metabolic Panel: Recent Labs  Lab 07/09/20 1326  NA 137  K 4.2  CL 98  CO2 29  GLUCOSE 143*  BUN 13  CREATININE 0.70  CALCIUM 9.5  MG 1.9    ABG: Recent Labs  Lab 07/09/20 0541 07/10/20 1150  PHART 7.443 7.475*  PCO2ART 43.8 37.9  PO2ART 50.0* 59.1*  HCO3 29.5* 28.0  O2SAT 85.6 93.2    Liver Function Tests: No results for input(s): AST, ALT, ALKPHOS, BILITOT, PROT, ALBUMIN in the last 168 hours. No results for input(s): LIPASE, AMYLASE in the last 168 hours. No results for input(s): AMMONIA in the last 168 hours.  CBC: Recent Labs  Lab 07/09/20 1326 07/11/20 0515  WBC 4.1 4.5  HGB 8.2* 7.6*  HCT 24.7* 24.1*  MCV 105.1* 105.2*  PLT 268 213    Cardiac Enzymes: No results for input(s): CKTOTAL, CKMB, CKMBINDEX, TROPONINI in the  last 168 hours.  BNP (last 3 results) No results for input(s): BNP in the last 8760 hours.  ProBNP (last 3 results) No results for input(s): PROBNP in the last 8760 hours.  Radiological Exams: No results found.  Assessment/Plan Active Problems:   Acute on chronic respiratory failure with hypoxia (HCC)   COVID-19 virus infection   Tracheostomy status (HCC)   Pneumonia due to COVID-19 virus   Acute respiratory distress syndrome (ARDS) due to COVID-19 virus (HCC)   1. Acute on chronic respiratory failure hypoxia we will continue with full support on the ventilator.  Right now on pressure control mode she is not been able to tolerate decreasing the FiO2 2. ARDS physiology we are ventilating accordingly 3. COVID-19 virus infection recovery we will continue to follow 4. Pneumonia due to COVID-19 treated we will continue present management 5. Tracheostomy will remain in place   I have personally seen and evaluated the patient, evaluated laboratory and imaging results, formulated the assessment and plan and placed orders. The Patient requires high complexity decision making with multiple systems involvement.  Rounds were done with the Respiratory Therapy Director and Staff therapists and discussed with nursing staff also.  Yevonne Pax, MD [facility] Pulmonary Critical Care Medicine Sleep Medicine

## 2020-07-13 ENCOUNTER — Other Ambulatory Visit (HOSPITAL_COMMUNITY): Payer: Medicare (Managed Care)

## 2020-07-13 DIAGNOSIS — J1282 Pneumonia due to coronavirus disease 2019: Secondary | ICD-10-CM | POA: Diagnosis not present

## 2020-07-13 DIAGNOSIS — Z930 Tracheostomy status: Secondary | ICD-10-CM | POA: Diagnosis not present

## 2020-07-13 DIAGNOSIS — U071 COVID-19: Secondary | ICD-10-CM | POA: Diagnosis not present

## 2020-07-13 DIAGNOSIS — J80 Acute respiratory distress syndrome: Secondary | ICD-10-CM | POA: Diagnosis not present

## 2020-07-13 DIAGNOSIS — J9621 Acute and chronic respiratory failure with hypoxia: Secondary | ICD-10-CM | POA: Diagnosis not present

## 2020-07-13 NOTE — Progress Notes (Signed)
Pulmonary Critical Care Medicine [facility] [place]   PULMONARY CRITICAL CARE SERVICE  PROGRESS NOTE  Date of Service: 07/13/2020  Emma Anderson  GYI:[id number]  DOB: 10-Jun-1960   DOA: 06/15/2020  Referring Physician: Carron Curie, MD  HPI: Emma Anderson is a 60 y.o. female seen for follow up of Acute on Chronic Respiratory Failure.  Patient remains on pressure control mode has been on 70% FiO2  Medications: Reviewed on Rounds  Physical Exam:  Vitals: Temperature is 98.7 pulse 80 respiratory 26 blood pressure is 108/45 saturations 100%  Ventilator Settings on pressure assist control FiO2 70% IP 18 PEEP of 7  . General: Comfortable at this time . Eyes: Grossly normal lids, irises & conjunctiva . ENT: grossly tongue is normal . Neck: no obvious mass . Cardiovascular: S1 S2 normal no gallop . Respiratory: Scattered rhonchi expansion is equal . Abdomen: soft . Skin: no rash seen on limited exam . Musculoskeletal: not rigid . Psychiatric:unable to assess . Neurologic: no seizure no involuntary movements         Lab Data:   Basic Metabolic Panel: Recent Labs  Lab 07/09/20 1326  NA 137  K 4.2  CL 98  CO2 29  GLUCOSE 143*  BUN 13  CREATININE 0.70  CALCIUM 9.5  MG 1.9    ABG: Recent Labs  Lab 07/09/20 0541 07/10/20 1150  PHART 7.443 7.475*  PCO2ART 43.8 37.9  PO2ART 50.0* 59.1*  HCO3 29.5* 28.0  O2SAT 85.6 93.2    Liver Function Tests: No results for input(s): AST, ALT, ALKPHOS, BILITOT, PROT, ALBUMIN in the last 168 hours. No results for input(s): LIPASE, AMYLASE in the last 168 hours. No results for input(s): AMMONIA in the last 168 hours.  CBC: Recent Labs  Lab 07/09/20 1326 07/11/20 0515  WBC 4.1 4.5  HGB 8.2* 7.6*  HCT 24.7* 24.1*  MCV 105.1* 105.2*  PLT 268 213    Cardiac Enzymes: No results for input(s): CKTOTAL, CKMB, CKMBINDEX, TROPONINI in the last 168 hours.  BNP (last 3 results) No results for input(s): BNP in the  last 8760 hours.  ProBNP (last 3 results) No results for input(s): PROBNP in the last 8760 hours.  Radiological Exams: DG Chest Port 1 View  Result Date: 07/13/2020 CLINICAL DATA:  Shortness of breath EXAM: PORTABLE CHEST 1 VIEW COMPARISON:  07/08/2020 FINDINGS: Tracheostomy is unchanged. Diffuse bilateral airspace disease again noted, stable on the left, slightly worsened on the right since prior study. Mild cardiomegaly. No effusions or pneumothorax. IMPRESSION: Bilateral airspace disease, worsening on the right since prior study. Electronically Signed   By: Charlett Nose M.D.   On: 07/13/2020 08:21    Assessment/Plan Active Problems:   Acute on chronic respiratory failure with hypoxia (HCC)   COVID-19 virus infection   Tracheostomy status (HCC)   Pneumonia due to COVID-19 virus   Acute respiratory distress syndrome (ARDS) due to COVID-19 virus (HCC)   1. Acute on chronic respiratory failure hypoxia we will continue with pressure control mode patient's mechanics have been poor not able to tolerate any 2. COVID-19 virus infection recovery 3. Pneumonia due to COVID-19 no change stable x-ray findings noted with slight worsening on the right side 4. Tracheostomy remains in place 5. ARDS no change oxygen requirements are still at 70%   I have personally seen and evaluated the patient, evaluated laboratory and imaging results, formulated the assessment and plan and placed orders. The Patient requires high complexity decision making with multiple systems involvement.  Rounds were  done with the Respiratory Therapy Director and Staff therapists and discussed with nursing staff also.  Allyne Gee, MD [facility] Pulmonary Critical Care Medicine Sleep Medicine

## 2020-07-14 DIAGNOSIS — U071 COVID-19: Secondary | ICD-10-CM | POA: Diagnosis not present

## 2020-07-14 DIAGNOSIS — J9621 Acute and chronic respiratory failure with hypoxia: Secondary | ICD-10-CM | POA: Diagnosis not present

## 2020-07-14 DIAGNOSIS — Z930 Tracheostomy status: Secondary | ICD-10-CM | POA: Diagnosis not present

## 2020-07-14 DIAGNOSIS — J80 Acute respiratory distress syndrome: Secondary | ICD-10-CM | POA: Diagnosis not present

## 2020-07-14 DIAGNOSIS — J1282 Pneumonia due to coronavirus disease 2019: Secondary | ICD-10-CM | POA: Diagnosis not present

## 2020-07-14 LAB — BASIC METABOLIC PANEL
Anion gap: 10 (ref 5–15)
BUN: 31 mg/dL — ABNORMAL HIGH (ref 8–23)
CO2: 26 mmol/L (ref 22–32)
Calcium: 9.6 mg/dL (ref 8.9–10.3)
Chloride: 100 mmol/L (ref 98–111)
Creatinine, Ser: 0.66 mg/dL (ref 0.44–1.00)
GFR, Estimated: >60 mL/min (ref >60)
Glucose, Bld: 227 mg/dL — ABNORMAL HIGH (ref 70–99)
Potassium: 5.1 mmol/L (ref 3.5–5.1)
Sodium: 136 mmol/L (ref 135–145)

## 2020-07-14 LAB — CBC
HCT: 23.6 % — ABNORMAL LOW (ref 36.0–46.0)
Hemoglobin: 7.6 g/dL — ABNORMAL LOW (ref 12.0–15.0)
MCH: 33.9 pg (ref 26.0–34.0)
MCHC: 32.2 g/dL (ref 30.0–36.0)
MCV: 105.4 fL — ABNORMAL HIGH (ref 80.0–100.0)
Platelets: 244 10*3/uL (ref 150–400)
RBC: 2.24 MIL/uL — ABNORMAL LOW (ref 3.87–5.11)
RDW: 15.6 % — ABNORMAL HIGH (ref 11.5–15.5)
WBC: 7.9 10*3/uL (ref 4.0–10.5)
nRBC: 0.6 % — ABNORMAL HIGH (ref 0.0–0.2)

## 2020-07-14 NOTE — Progress Notes (Signed)
Pulmonary Critical Care Medicine [facility] [place]   PULMONARY CRITICAL CARE SERVICE  PROGRESS NOTE  Date of Service: 07/14/2020  Emma Anderson  [id number]:[id number]  DOB: [birth year]   DOA: 06/15/2020  Referring Physician: Carron Curie, MD  HPI: Emma Anderson is a 60 y.o. female seen for follow up of Acute on Chronic Respiratory Failure.  Patient currently is on full support on the ventilator on pressure control mode has not been tolerating attempts at weaning  Medications: Reviewed on Rounds  Physical Exam:  Vitals: Temperature is 96.7 pulse 72 respiratory rate is 30 blood pressure 140/85 saturations 98%  Ventilator Settings on pressure assist control FiO2 70% IP 18 PEEP 7  . General: Comfortable at this time . Eyes: Grossly normal lids, irises & conjunctiva . ENT: grossly tongue is normal . Neck: no obvious mass . Cardiovascular: S1 S2 normal no gallop . Respiratory: Scattered rhonchi expansion is equal . Abdomen: soft . Skin: no rash seen on limited exam . Musculoskeletal: not rigid . Psychiatric:unable to assess . Neurologic: no seizure no involuntary movements         Lab Data:   Basic Metabolic Panel: Recent Labs  Lab 07/09/20 1326 07/14/20 0602  NA 137 136  K 4.2 5.1  CL 98 100  CO2 29 26  GLUCOSE 143* 227*  BUN 13 31*  CREATININE 0.70 0.66  CALCIUM 9.5 9.6  MG 1.9  --     ABG: Recent Labs  Lab 07/09/20 0541 07/10/20 1150  PHART 7.443 7.475*  PCO2ART 43.8 37.9  PO2ART 50.0* 59.1*  HCO3 29.5* 28.0  O2SAT 85.6 93.2    Liver Function Tests: No results for input(s): AST, ALT, ALKPHOS, BILITOT, PROT, ALBUMIN in the last 168 hours. No results for input(s): LIPASE, AMYLASE in the last 168 hours. No results for input(s): AMMONIA in the last 168 hours.  CBC: Recent Labs  Lab 07/09/20 1326 07/11/20 0515 07/14/20 0602  WBC 4.1 4.5 7.9  HGB 8.2* 7.6* 7.6*  HCT 24.7* 24.1* 23.6*  MCV 105.1* 105.2* 105.4*  PLT 268 213 244     Cardiac Enzymes: No results for input(s): CKTOTAL, CKMB, CKMBINDEX, TROPONINI in the last 168 hours.  BNP (last 3 results) No results for input(s): BNP in the last 8760 hours.  ProBNP (last 3 results) No results for input(s): PROBNP in the last 8760 hours.  Radiological Exams: DG Chest Port 1 View  Result Date: 07/13/2020 CLINICAL DATA:  Shortness of breath EXAM: PORTABLE CHEST 1 VIEW COMPARISON:  07/08/2020 FINDINGS: Tracheostomy is unchanged. Diffuse bilateral airspace disease again noted, stable on the left, slightly worsened on the right since prior study. Mild cardiomegaly. No effusions or pneumothorax. IMPRESSION: Bilateral airspace disease, worsening on the right since prior study. Electronically Signed   By: Charlett Nose M.D.   On: 07/13/2020 08:21    Assessment/Plan Active Problems:   Acute on chronic respiratory failure with hypoxia (HCC)   COVID-19 virus infection   Tracheostomy status (HCC)   Pneumonia due to COVID-19 virus   Acute respiratory distress syndrome (ARDS) due to COVID-19 virus (HCC)   1. Acute on chronic respiratory failure hypoxia we will continue with pressure assist control FiO2 is at 70% and try to titrate down 2. COVID-19 virus infection recovery 3. Pneumonia due to COVID-19 treated slowly improving 4. ARDS again slow improvement 5. Tracheostomy will remain in place   I have personally seen and evaluated the patient, evaluated laboratory and imaging results, formulated the assessment and plan and placed orders.  The Patient requires high complexity decision making with multiple systems involvement.  Rounds were done with the Respiratory Therapy Director and Staff therapists and discussed with nursing staff also.  Allyne Gee, MD [facility] Pulmonary Critical Care Medicine Sleep Medicine

## 2020-07-15 DIAGNOSIS — J9621 Acute and chronic respiratory failure with hypoxia: Secondary | ICD-10-CM | POA: Diagnosis not present

## 2020-07-15 DIAGNOSIS — U071 COVID-19: Secondary | ICD-10-CM | POA: Diagnosis not present

## 2020-07-15 DIAGNOSIS — J80 Acute respiratory distress syndrome: Secondary | ICD-10-CM | POA: Diagnosis not present

## 2020-07-15 DIAGNOSIS — J1282 Pneumonia due to coronavirus disease 2019: Secondary | ICD-10-CM | POA: Diagnosis not present

## 2020-07-15 DIAGNOSIS — Z930 Tracheostomy status: Secondary | ICD-10-CM | POA: Diagnosis not present

## 2020-07-15 NOTE — Progress Notes (Signed)
Pulmonary Critical Care Medicine [facility] [place]   PULMONARY CRITICAL CARE SERVICE  PROGRESS NOTE  Date of Service: 07/15/2020  Emma Anderson  JQZ:[id number]  DOB: 18-Jun-1960   DOA: 06/15/2020  Referring Physician: Carron Curie, MD  HPI: Emma Anderson is a 60 y.o. female seen for follow up of Acute on Chronic Respiratory Failure.  Patient is currently on full support and pressure assist control mode FiO2 was decreased down to 65%  Medications: Reviewed on Rounds  Physical Exam:  Vitals: Temperature 97.9 pulse 98 respiratory 23 blood pressure is 130/72 saturations 98%  Ventilator Settings on pressure assist control FiO2 of 65% IP 18 PEEP 7  . General: Comfortable at this time . Eyes: Grossly normal lids, irises & conjunctiva . ENT: grossly tongue is normal . Neck: no obvious mass . Cardiovascular: S1 S2 normal no gallop . Respiratory: Scattered rhonchi expansion is equal . Abdomen: soft . Skin: no rash seen on limited exam . Musculoskeletal: not rigid . Psychiatric:unable to assess . Neurologic: no seizure no involuntary movements         Lab Data:   Basic Metabolic Panel: Recent Labs  Lab 07/09/20 1326 07/14/20 0602  NA 137 136  K 4.2 5.1  CL 98 100  CO2 29 26  GLUCOSE 143* 227*  BUN 13 31*  CREATININE 0.70 0.66  CALCIUM 9.5 9.6  MG 1.9  --     ABG: Recent Labs  Lab 07/09/20 0541 07/10/20 1150  PHART 7.443 7.475*  PCO2ART 43.8 37.9  PO2ART 50.0* 59.1*  HCO3 29.5* 28.0  O2SAT 85.6 93.2    Liver Function Tests: No results for input(s): AST, ALT, ALKPHOS, BILITOT, PROT, ALBUMIN in the last 168 hours. No results for input(s): LIPASE, AMYLASE in the last 168 hours. No results for input(s): AMMONIA in the last 168 hours.  CBC: Recent Labs  Lab 07/09/20 1326 07/11/20 0515 07/14/20 0602  WBC 4.1 4.5 7.9  HGB 8.2* 7.6* 7.6*  HCT 24.7* 24.1* 23.6*  MCV 105.1* 105.2* 105.4*  PLT 268 213 244    Cardiac Enzymes: No results for  input(s): CKTOTAL, CKMB, CKMBINDEX, TROPONINI in the last 168 hours.  BNP (last 3 results) No results for input(s): BNP in the last 8760 hours.  ProBNP (last 3 results) No results for input(s): PROBNP in the last 8760 hours.  Radiological Exams: No results found.  Assessment/Plan Active Problems:   Acute on chronic respiratory failure with hypoxia (HCC)   COVID-19 virus infection   Tracheostomy status (HCC)   Pneumonia due to COVID-19 virus   Acute respiratory distress syndrome (ARDS) due to COVID-19 virus (HCC)   1. Acute on chronic respiratory failure hypoxia we will continue with full support on the ventilator right now try to wean the FiO2 down. 2. COVID-19 virus infection recovery we will continue to follow along. 3. Tracheostomy will remain in place 4. Pneumonia due to COVID-19 treated 5. ARDS at baseline we will continue to follow   I have personally seen and evaluated the patient, evaluated laboratory and imaging results, formulated the assessment and plan and placed orders. The Patient requires high complexity decision making with multiple systems involvement.  Rounds were done with the Respiratory Therapy Director and Staff therapists and discussed with nursing staff also.  Yevonne Pax, MD [facility] Pulmonary Critical Care Medicine Sleep Medicine

## 2020-07-16 ENCOUNTER — Other Ambulatory Visit (HOSPITAL_COMMUNITY): Payer: Medicare (Managed Care)

## 2020-07-16 DIAGNOSIS — Z930 Tracheostomy status: Secondary | ICD-10-CM | POA: Diagnosis not present

## 2020-07-16 DIAGNOSIS — J9621 Acute and chronic respiratory failure with hypoxia: Secondary | ICD-10-CM | POA: Diagnosis not present

## 2020-07-16 DIAGNOSIS — U071 COVID-19: Secondary | ICD-10-CM | POA: Diagnosis not present

## 2020-07-16 DIAGNOSIS — J80 Acute respiratory distress syndrome: Secondary | ICD-10-CM | POA: Diagnosis not present

## 2020-07-16 DIAGNOSIS — J1282 Pneumonia due to coronavirus disease 2019: Secondary | ICD-10-CM | POA: Diagnosis not present

## 2020-07-16 LAB — VANCOMYCIN, TROUGH: Vancomycin Tr: 24 ug/mL (ref 15–20)

## 2020-07-16 LAB — BASIC METABOLIC PANEL
Anion gap: 11 (ref 5–15)
BUN: 25 mg/dL — ABNORMAL HIGH (ref 8–23)
CO2: 30 mmol/L (ref 22–32)
Calcium: 9.9 mg/dL (ref 8.9–10.3)
Chloride: 97 mmol/L — ABNORMAL LOW (ref 98–111)
Creatinine, Ser: 0.68 mg/dL (ref 0.44–1.00)
GFR, Estimated: >60 mL/min (ref >60)
Glucose, Bld: 243 mg/dL — ABNORMAL HIGH (ref 70–99)
Potassium: 4.5 mmol/L (ref 3.5–5.1)
Sodium: 138 mmol/L (ref 135–145)

## 2020-07-16 LAB — CBC
HCT: 25.3 % — ABNORMAL LOW (ref 36.0–46.0)
Hemoglobin: 8.2 g/dL — ABNORMAL LOW (ref 12.0–15.0)
MCH: 35.2 pg — ABNORMAL HIGH (ref 26.0–34.0)
MCHC: 32.4 g/dL (ref 30.0–36.0)
MCV: 108.6 fL — ABNORMAL HIGH (ref 80.0–100.0)
Platelets: 269 10*3/uL (ref 150–400)
RBC: 2.33 MIL/uL — ABNORMAL LOW (ref 3.87–5.11)
RDW: 15.9 % — ABNORMAL HIGH (ref 11.5–15.5)
WBC: 10.1 10*3/uL (ref 4.0–10.5)
nRBC: 0.2 % (ref 0.0–0.2)

## 2020-07-16 NOTE — Progress Notes (Signed)
Pulmonary Critical Care Medicine [facility] [place]   PULMONARY CRITICAL CARE SERVICE  PROGRESS NOTE  Date of Service: 07/16/2020  Emma Anderson  [id number]  DOB: Dec 25, 1960   DOA: 06/15/2020  Referring Physician: Carron Curie, MD  HPI: Emma Anderson is a 60 y.o. female seen for follow up of Acute on Chronic Respiratory Failure.  Patient is on pressure support right now requiring 55% FiO2  Medications: Reviewed on Rounds  Physical Exam:  Vitals: Temperature is 96.7 pulse 72 respiratory 21 blood pressure is 104/68 saturations 100%  Ventilator Settings on pressure support FiO2 55% pressure 12/5   General: Comfortable at this time  Eyes: Grossly normal lids, irises & conjunctiva  ENT: grossly tongue is normal  Neck: no obvious mass  Cardiovascular: S1 S2 normal no gallop  Respiratory: Coarse rhonchi expansion is equal  Abdomen: soft  Skin: no rash seen on limited exam  Musculoskeletal: not rigid  Psychiatric:unable to assess  Neurologic: no seizure no involuntary movements         Lab Data:   Basic Metabolic Panel: Recent Labs  Lab 07/14/20 0602 07/16/20 0433  NA 136 138  K 5.1 4.5  CL 100 97*  CO2 26 30  GLUCOSE 227* 243*  BUN 31* 25*  CREATININE 0.66 0.68  CALCIUM 9.6 9.9    ABG: Recent Labs  Lab 07/10/20 1150  PHART 7.475*  PCO2ART 37.9  PO2ART 59.1*  HCO3 28.0  O2SAT 93.2    Liver Function Tests: No results for input(s): AST, ALT, ALKPHOS, BILITOT, PROT, ALBUMIN in the last 168 hours. No results for input(s): LIPASE, AMYLASE in the last 168 hours. No results for input(s): AMMONIA in the last 168 hours.  CBC: Recent Labs  Lab 07/11/20 0515 07/14/20 0602 07/16/20 0433  WBC 4.5 7.9 10.1  HGB 7.6* 7.6* 8.2*  HCT 24.1* 23.6* 25.3*  MCV 105.2* 105.4* 108.6*  PLT 213 244 269    Cardiac Enzymes: No results for input(s): CKTOTAL, CKMB, CKMBINDEX, TROPONINI in the last 168 hours.  BNP (last 3 results) No results  for input(s): BNP in the last 8760 hours.  ProBNP (last 3 results) No results for input(s): PROBNP in the last 8760 hours.  Radiological Exams: DG CHEST PORT 1 VIEW  Result Date: 07/16/2020 CLINICAL DATA:  Pneumonia, edema EXAM: PORTABLE CHEST 1 VIEW COMPARISON:  Radiograph 07/13/2020, CT 07/09/2020 FINDINGS: Tracheostomy tube tip terminates in the mid to upper trachea, 6.5 cm from the carina. Telemetry leads and support devices overlie the chest. Diffuse heterogeneous opacities again seen throughout both lungs with some slight interval decrease likely related to improving lung volumes. Stable cardiomediastinal contours with mild cardiomegaly. No acute osseous or soft tissue abnormality. IMPRESSION: Slightly diminished heterogeneous opacities in both lungs, possibly related to improving lung volumes. Electronically Signed   By: Kreg Shropshire M.D.   On: 07/16/2020 06:36    Assessment/Plan Active Problems:   Acute on chronic respiratory failure with hypoxia (HCC)   COVID-19 virus infection   Tracheostomy status (HCC)   Pneumonia due to COVID-19 virus   Acute respiratory distress syndrome (ARDS) due to COVID-19 virus (HCC)   1. Acute on chronic respiratory failure with hypoxia we will wean on pressure support as tolerated currently on 12/5 2. COVID-19 virus infection recovery 3. Tracheostomy remains in place 4. Pneumonia due to COVID-19 slow improvement 5. ARDS also showing slow improvement   I have personally seen and evaluated the patient, evaluated laboratory and imaging results, formulated the assessment and plan and placed  orders. The Patient requires high complexity decision making with multiple systems involvement.  Rounds were done with the Respiratory Therapy Director and Staff therapists and discussed with nursing staff also.  Yevonne Pax, MD [facility] Pulmonary Critical Care Medicine Sleep Medicine

## 2020-07-17 DIAGNOSIS — U071 COVID-19: Secondary | ICD-10-CM | POA: Diagnosis not present

## 2020-07-17 DIAGNOSIS — J1282 Pneumonia due to coronavirus disease 2019: Secondary | ICD-10-CM | POA: Diagnosis not present

## 2020-07-17 DIAGNOSIS — J80 Acute respiratory distress syndrome: Secondary | ICD-10-CM | POA: Diagnosis not present

## 2020-07-17 DIAGNOSIS — Z930 Tracheostomy status: Secondary | ICD-10-CM | POA: Diagnosis not present

## 2020-07-17 DIAGNOSIS — J9621 Acute and chronic respiratory failure with hypoxia: Secondary | ICD-10-CM | POA: Diagnosis not present

## 2020-07-17 LAB — VANCOMYCIN, TROUGH: Vancomycin Tr: 10 ug/mL — ABNORMAL LOW (ref 15–20)

## 2020-07-17 NOTE — Progress Notes (Signed)
Pulmonary Critical Care Medicine [facility] [place]   PULMONARY CRITICAL CARE SERVICE  PROGRESS NOTE  Date of Service: 07/17/2020  Emma Anderson  [id number]  DOB: 12-31-1960   DOA: 06/15/2020  Referring Physician: Carron Curie, MD  HPI: Stachia Slutsky is a 60 y.o. female seen for follow up of Acute on Chronic Respiratory Failure.  This morning patient was on the ventilator her oxygenation has improved and she is now down to 50% FiO2  Medications: Reviewed on Rounds  Physical Exam:  Vitals: Temperature is 98.2 pulse 64 respiratory 21 blood pressure 100/66 saturations 100%  Ventilator Settings on pressure assist control FiO2 50% IP 18 PEEP 5  . General: Comfortable at this time . Eyes: Grossly normal lids, irises & conjunctiva . ENT: grossly tongue is normal . Neck: no obvious mass . Cardiovascular: S1 S2 normal no gallop . Respiratory: Coarse rhonchi expansion equal . Abdomen: soft . Skin: no rash seen on limited exam . Musculoskeletal: not rigid . Psychiatric:unable to assess . Neurologic: no seizure no involuntary movements         Lab Data:   Basic Metabolic Panel: Recent Labs  Lab 07/14/20 0602 07/16/20 0433  NA 136 138  K 5.1 4.5  CL 100 97*  CO2 26 30  GLUCOSE 227* 243*  BUN 31* 25*  CREATININE 0.66 0.68  CALCIUM 9.6 9.9    ABG: Recent Labs  Lab 07/10/20 1150  PHART 7.475*  PCO2ART 37.9  PO2ART 59.1*  HCO3 28.0  O2SAT 93.2    Liver Function Tests: No results for input(s): AST, ALT, ALKPHOS, BILITOT, PROT, ALBUMIN in the last 168 hours. No results for input(s): LIPASE, AMYLASE in the last 168 hours. No results for input(s): AMMONIA in the last 168 hours.  CBC: Recent Labs  Lab 07/11/20 0515 07/14/20 0602 07/16/20 0433  WBC 4.5 7.9 10.1  HGB 7.6* 7.6* 8.2*  HCT 24.1* 23.6* 25.3*  MCV 105.2* 105.4* 108.6*  PLT 213 244 269    Cardiac Enzymes: No results for input(s): CKTOTAL, CKMB, CKMBINDEX, TROPONINI in the last  168 hours.  BNP (last 3 results) No results for input(s): BNP in the last 8760 hours.  ProBNP (last 3 results) No results for input(s): PROBNP in the last 8760 hours.  Radiological Exams: DG CHEST PORT 1 VIEW  Result Date: 07/16/2020 CLINICAL DATA:  Pneumonia, edema EXAM: PORTABLE CHEST 1 VIEW COMPARISON:  Radiograph 07/13/2020, CT 07/09/2020 FINDINGS: Tracheostomy tube tip terminates in the mid to upper trachea, 6.5 cm from the carina. Telemetry leads and support devices overlie the chest. Diffuse heterogeneous opacities again seen throughout both lungs with some slight interval decrease likely related to improving lung volumes. Stable cardiomediastinal contours with mild cardiomegaly. No acute osseous or soft tissue abnormality. IMPRESSION: Slightly diminished heterogeneous opacities in both lungs, possibly related to improving lung volumes. Electronically Signed   By: Kreg Shropshire M.D.   On: 07/16/2020 06:36    Assessment/Plan Active Problems:   Acute on chronic respiratory failure with hypoxia (HCC)   COVID-19 virus infection   Tracheostomy status (HCC)   Pneumonia due to COVID-19 virus   Acute respiratory distress syndrome (ARDS) due to COVID-19 virus (HCC)   1. Acute on chronic respiratory failure hypoxia plan is to wean on pressure support 12/5 today the goal will be 48 hours 2. COVID-19 virus infection recovery 3. Tracheostomy remains in place 4. Pneumonia due to COVID-19 slow improvement 5. ARDS improving the x-ray shows some diminished opacity   I have personally seen  and evaluated the patient, evaluated laboratory and imaging results, formulated the assessment and plan and placed orders. The Patient requires high complexity decision making with multiple systems involvement.  Rounds were done with the Respiratory Therapy Director and Staff therapists and discussed with nursing staff also.  Allyne Gee, MD [facility] Pulmonary Critical Care Medicine Sleep Medicine

## 2020-07-18 DIAGNOSIS — J80 Acute respiratory distress syndrome: Secondary | ICD-10-CM | POA: Diagnosis not present

## 2020-07-18 DIAGNOSIS — U071 COVID-19: Secondary | ICD-10-CM | POA: Diagnosis not present

## 2020-07-18 DIAGNOSIS — J9621 Acute and chronic respiratory failure with hypoxia: Secondary | ICD-10-CM | POA: Diagnosis not present

## 2020-07-18 DIAGNOSIS — J1282 Pneumonia due to coronavirus disease 2019: Secondary | ICD-10-CM | POA: Diagnosis not present

## 2020-07-18 DIAGNOSIS — Z930 Tracheostomy status: Secondary | ICD-10-CM | POA: Diagnosis not present

## 2020-07-18 NOTE — Progress Notes (Signed)
Pulmonary Critical Care Medicine [facility] [place]   PULMONARY CRITICAL CARE SERVICE  PROGRESS NOTE  Date of Service: 07/18/2020  Emma Anderson  [id number]:[id number]  DOB: 1960/09/30   DOA: 06/15/2020  Referring Physician: Carron Curie, MD  HPI: Emma Anderson is a 60 y.o. female seen for follow up of Acute on Chronic Respiratory Failure.  Patient currently is on pressure control mode with an IP of 12 supposed to do pressure support for 12 hours today  Medications: Reviewed on Rounds  Physical Exam:  Vitals: Temperature 96.6 pulse 63 respiratory rate 24 blood pressure is 119/74 saturations 97%  Ventilator Settings on pressure control FiO2 50% IP 12  . General: Comfortable at this time . Eyes: Grossly normal lids, irises & conjunctiva . ENT: grossly tongue is normal . Neck: no obvious mass . Cardiovascular: S1 S2 normal no gallop . Respiratory: No rhonchi no rales noted at this time . Abdomen: soft . Skin: no rash seen on limited exam . Musculoskeletal: not rigid . Psychiatric:unable to assess . Neurologic: no seizure no involuntary movements         Lab Data:   Basic Metabolic Panel: Recent Labs  Lab 07/14/20 0602 07/16/20 0433  NA 136 138  K 5.1 4.5  CL 100 97*  CO2 26 30  GLUCOSE 227* 243*  BUN 31* 25*  CREATININE 0.66 0.68  CALCIUM 9.6 9.9    ABG: No results for input(s): PHART, PCO2ART, PO2ART, HCO3, O2SAT in the last 168 hours.  Liver Function Tests: No results for input(s): AST, ALT, ALKPHOS, BILITOT, PROT, ALBUMIN in the last 168 hours. No results for input(s): LIPASE, AMYLASE in the last 168 hours. No results for input(s): AMMONIA in the last 168 hours.  CBC: Recent Labs  Lab 07/14/20 0602 07/16/20 0433  WBC 7.9 10.1  HGB 7.6* 8.2*  HCT 23.6* 25.3*  MCV 105.4* 108.6*  PLT 244 269    Cardiac Enzymes: No results for input(s): CKTOTAL, CKMB, CKMBINDEX, TROPONINI in the last 168 hours.  BNP (last 3 results) No results for  input(s): BNP in the last 8760 hours.  ProBNP (last 3 results) No results for input(s): PROBNP in the last 8760 hours.  Radiological Exams: No results found.  Assessment/Plan Active Problems:   Acute on chronic respiratory failure with hypoxia (HCC)   COVID-19 virus infection   Tracheostomy status (HCC)   Pneumonia due to COVID-19 virus   Acute respiratory distress syndrome (ARDS) due to COVID-19 virus (HCC)   1. Acute on chronic respiratory failure with hypoxia we will continue with the weaning on pressure support 12/5 today. 2. COVID-19 virus infection recovery 3. Tracheostomy remains in place 4. Pneumonia due to COVID-19 slow improvement 5. ARDS slow improvement we will continue to follow   I have personally seen and evaluated the patient, evaluated laboratory and imaging results, formulated the assessment and plan and placed orders. The Patient requires high complexity decision making with multiple systems involvement.  Rounds were done with the Respiratory Therapy Director and Staff therapists and discussed with nursing staff also.  Yevonne Pax, MD [facility] Pulmonary Critical Care Medicine Sleep Medicine

## 2020-07-19 DIAGNOSIS — J80 Acute respiratory distress syndrome: Secondary | ICD-10-CM | POA: Diagnosis not present

## 2020-07-19 DIAGNOSIS — U071 COVID-19: Secondary | ICD-10-CM | POA: Diagnosis not present

## 2020-07-19 DIAGNOSIS — Z930 Tracheostomy status: Secondary | ICD-10-CM | POA: Diagnosis not present

## 2020-07-19 DIAGNOSIS — J1282 Pneumonia due to coronavirus disease 2019: Secondary | ICD-10-CM | POA: Diagnosis not present

## 2020-07-19 DIAGNOSIS — J9621 Acute and chronic respiratory failure with hypoxia: Secondary | ICD-10-CM | POA: Diagnosis not present

## 2020-07-19 LAB — VANCOMYCIN, TROUGH: Vancomycin Tr: 21 ug/mL (ref 15–20)

## 2020-07-19 NOTE — Progress Notes (Signed)
Pulmonary Critical Care Medicine [facility] [place]   PULMONARY CRITICAL CARE SERVICE  PROGRESS NOTE  Date of Service: 07/19/2020  Emma Anderson  KGU:[id number]  DOB: [birth year]   DOA: 06/15/2020  Referring Physician: Carron Curie, MD  HPI: Emma Anderson is a 60 y.o. female seen for follow up of Acute on Chronic Respiratory Failure.  She is doing well should be able to do 16 hours of pressure support  Medications: Reviewed on Rounds  Physical Exam:  Vitals: Temperature 96.1 pulse 64 respiratory rate is 15 blood pressure is 119/75 saturations 100%  Ventilator Settings on pressure support FiO2 of 40% pressure 12/6  . General: Comfortable at this time . Eyes: Grossly normal lids, irises & conjunctiva . ENT: grossly tongue is normal . Neck: no obvious mass . Cardiovascular: S1 S2 normal no gallop . Respiratory: No rhonchi very coarse breath sounds . Abdomen: soft . Skin: no rash seen on limited exam . Musculoskeletal: not rigid . Psychiatric:unable to assess . Neurologic: no seizure no involuntary movements         Lab Data:   Basic Metabolic Panel: Recent Labs  Lab 07/14/20 0602 07/16/20 0433  NA 136 138  K 5.1 4.5  CL 100 97*  CO2 26 30  GLUCOSE 227* 243*  BUN 31* 25*  CREATININE 0.66 0.68  CALCIUM 9.6 9.9    ABG: No results for input(s): PHART, PCO2ART, PO2ART, HCO3, O2SAT in the last 168 hours.  Liver Function Tests: No results for input(s): AST, ALT, ALKPHOS, BILITOT, PROT, ALBUMIN in the last 168 hours. No results for input(s): LIPASE, AMYLASE in the last 168 hours. No results for input(s): AMMONIA in the last 168 hours.  CBC: Recent Labs  Lab 07/14/20 0602 07/16/20 0433  WBC 7.9 10.1  HGB 7.6* 8.2*  HCT 23.6* 25.3*  MCV 105.4* 108.6*  PLT 244 269    Cardiac Enzymes: No results for input(s): CKTOTAL, CKMB, CKMBINDEX, TROPONINI in the last 168 hours.  BNP (last 3 results) No results for input(s): BNP in the last 8760  hours.  ProBNP (last 3 results) No results for input(s): PROBNP in the last 8760 hours.  Radiological Exams: No results found.  Assessment/Plan Active Problems:   Acute on chronic respiratory failure with hypoxia (HCC)   COVID-19 virus infection   Tracheostomy status (HCC)   Pneumonia due to COVID-19 virus   Acute respiratory distress syndrome (ARDS) due to COVID-19 virus (HCC)   1. Acute on chronic respiratory failure hypoxia we will continue with the wean on pressure support titrate oxygen continue pulmonary toilet. 2. COVID-19 virus infection recovery we will continue to follow 3. Tracheostomy remains in place 4. Pneumonia due to COVID-19 treated 5. ARDS slow to improve   I have personally seen and evaluated the patient, evaluated laboratory and imaging results, formulated the assessment and plan and placed orders. The Patient requires high complexity decision making with multiple systems involvement.  Rounds were done with the Respiratory Therapy Director and Staff therapists and discussed with nursing staff also.  Yevonne Pax, MD [facility] Pulmonary Critical Care Medicine Sleep Medicine

## 2020-07-20 DIAGNOSIS — J1282 Pneumonia due to coronavirus disease 2019: Secondary | ICD-10-CM | POA: Diagnosis not present

## 2020-07-20 DIAGNOSIS — U071 COVID-19: Secondary | ICD-10-CM | POA: Diagnosis not present

## 2020-07-20 DIAGNOSIS — J9621 Acute and chronic respiratory failure with hypoxia: Secondary | ICD-10-CM | POA: Diagnosis not present

## 2020-07-20 DIAGNOSIS — Z930 Tracheostomy status: Secondary | ICD-10-CM | POA: Diagnosis not present

## 2020-07-20 DIAGNOSIS — J80 Acute respiratory distress syndrome: Secondary | ICD-10-CM | POA: Diagnosis not present

## 2020-07-20 LAB — BASIC METABOLIC PANEL
Anion gap: 12 (ref 5–15)
BUN: 25 mg/dL — ABNORMAL HIGH (ref 8–23)
CO2: 29 mmol/L (ref 22–32)
Calcium: 9.5 mg/dL (ref 8.9–10.3)
Chloride: 95 mmol/L — ABNORMAL LOW (ref 98–111)
Creatinine, Ser: 0.57 mg/dL (ref 0.44–1.00)
GFR, Estimated: >60 mL/min (ref >60)
Glucose, Bld: 229 mg/dL — ABNORMAL HIGH (ref 70–99)
Potassium: 4.5 mmol/L (ref 3.5–5.1)
Sodium: 136 mmol/L (ref 135–145)

## 2020-07-20 LAB — CBC
HCT: 24.6 % — ABNORMAL LOW (ref 36.0–46.0)
Hemoglobin: 8 g/dL — ABNORMAL LOW (ref 12.0–15.0)
MCH: 34.6 pg — ABNORMAL HIGH (ref 26.0–34.0)
MCHC: 32.5 g/dL (ref 30.0–36.0)
MCV: 106.5 fL — ABNORMAL HIGH (ref 80.0–100.0)
Platelets: 321 10*3/uL (ref 150–400)
RBC: 2.31 MIL/uL — ABNORMAL LOW (ref 3.87–5.11)
RDW: 17 % — ABNORMAL HIGH (ref 11.5–15.5)
WBC: 7.3 10*3/uL (ref 4.0–10.5)
nRBC: 0.3 % — ABNORMAL HIGH (ref 0.0–0.2)

## 2020-07-20 LAB — MAGNESIUM: Magnesium: 2.1 mg/dL (ref 1.7–2.4)

## 2020-07-20 NOTE — Progress Notes (Signed)
Pulmonary Critical Care Medicine [facility] [place]   PULMONARY CRITICAL CARE SERVICE  PROGRESS NOTE  Date of Service: 07/20/2020  Emma Anderson  [id number]:[id number]  DOB: Jul 31, 1960   DOA: 06/15/2020  Referring Physician: Carron Curie, MD  HPI: Emma Anderson is a 60 y.o. female seen for follow up of Acute on Chronic Respiratory Failure.  Patient is on pressure control right now supposed to wean on T collar for 2 hours  Medications: Reviewed on Rounds  Physical Exam:  Vitals: Temperature is 96.0 pulse 75 respiratory rate 21 blood pressure is 102/67 saturations 99%  Ventilator Settings on pressure assist control FiO2 is 40% PEEP of 6  . General: Comfortable at this time . Eyes: Grossly normal lids, irises & conjunctiva . ENT: grossly tongue is normal . Neck: no obvious mass . Cardiovascular: S1 S2 normal no gallop . Respiratory: Scattered rhonchi . Abdomen: soft . Skin: no rash seen on limited exam . Musculoskeletal: not rigid . Psychiatric:unable to assess . Neurologic: no seizure no involuntary movements         Lab Data:   Basic Metabolic Panel: Recent Labs  Lab 07/14/20 0602 07/16/20 0433 07/20/20 0405  NA 136 138 136  K 5.1 4.5 4.5  CL 100 97* 95*  CO2 26 30 29   GLUCOSE 227* 243* 229*  BUN 31* 25* 25*  CREATININE 0.66 0.68 0.57  CALCIUM 9.6 9.9 9.5  MG  --   --  2.1    ABG: No results for input(s): PHART, PCO2ART, PO2ART, HCO3, O2SAT in the last 168 hours.  Liver Function Tests: No results for input(s): AST, ALT, ALKPHOS, BILITOT, PROT, ALBUMIN in the last 168 hours. No results for input(s): LIPASE, AMYLASE in the last 168 hours. No results for input(s): AMMONIA in the last 168 hours.  CBC: Recent Labs  Lab 07/14/20 0602 07/16/20 0433 07/20/20 0405  WBC 7.9 10.1 7.3  HGB 7.6* 8.2* 8.0*  HCT 23.6* 25.3* 24.6*  MCV 105.4* 108.6* 106.5*  PLT 244 269 321    Cardiac Enzymes: No results for input(s): CKTOTAL, CKMB, CKMBINDEX,  TROPONINI in the last 168 hours.  BNP (last 3 results) No results for input(s): BNP in the last 8760 hours.  ProBNP (last 3 results) No results for input(s): PROBNP in the last 8760 hours.  Radiological Exams: No results found.  Assessment/Plan Active Problems:   Acute on chronic respiratory failure with hypoxia (HCC)   COVID-19 virus infection   Tracheostomy status (HCC)   Pneumonia due to COVID-19 virus   Acute respiratory distress syndrome (ARDS) due to COVID-19 virus (HCC)   1. Acute on chronic respiratory failure hypoxia we will continue with the weaning process for T collar is for 2 hours. 2. COVID-19 virus infection recovery 3. Pneumonia due to COVID-19 treated slow improvement 4. Tracheostomy weaning gradually 5. ARDS slow improvement   I have personally seen and evaluated the patient, evaluated laboratory and imaging results, formulated the assessment and plan and placed orders. The Patient requires high complexity decision making with multiple systems involvement.  Rounds were done with the Respiratory Therapy Director and Staff therapists and discussed with nursing staff also.  07/22/20, MD [facility] Pulmonary Critical Care Medicine Sleep Medicine

## 2020-07-21 DIAGNOSIS — J80 Acute respiratory distress syndrome: Secondary | ICD-10-CM | POA: Diagnosis not present

## 2020-07-21 DIAGNOSIS — Z930 Tracheostomy status: Secondary | ICD-10-CM | POA: Diagnosis not present

## 2020-07-21 DIAGNOSIS — U071 COVID-19: Secondary | ICD-10-CM | POA: Diagnosis not present

## 2020-07-21 DIAGNOSIS — J9621 Acute and chronic respiratory failure with hypoxia: Secondary | ICD-10-CM | POA: Diagnosis not present

## 2020-07-21 DIAGNOSIS — J1282 Pneumonia due to coronavirus disease 2019: Secondary | ICD-10-CM | POA: Diagnosis not present

## 2020-07-21 LAB — VANCOMYCIN, TROUGH: Vancomycin Tr: 15 ug/mL (ref 15–20)

## 2020-07-21 NOTE — Progress Notes (Signed)
Pulmonary Critical Care Medicine [facility] [place]   PULMONARY CRITICAL CARE SERVICE  PROGRESS NOTE  Date of Service: 07/21/2020  Emma Anderson  YKD:[id number]  DOB: 1961-05-19   DOA: 06/15/2020  Referring Physician: Carron Curie, MD  HPI: Emma Anderson is a 60 y.o. female seen for follow up of Acute on Chronic Respiratory Failure.  Patient currently is on T collar has been on 40% FiO2 the goal is for 4 hours  Medications: Reviewed on Rounds  Physical Exam:  Vitals: Temperature is 98.0 pulse 65 respiratory rate is 22 blood pressure is 94/60 saturations 98%  Ventilator Settings off the ventilator on T collar with an FiO2 40%  . General: Comfortable at this time . Eyes: Grossly normal lids, irises & conjunctiva . ENT: grossly tongue is normal . Neck: no obvious mass . Cardiovascular: S1 S2 normal no gallop . Respiratory: Coarse rhonchi expansion is equal . Abdomen: soft . Skin: no rash seen on limited exam . Musculoskeletal: not rigid . Psychiatric:unable to assess . Neurologic: no seizure no involuntary movements         Lab Data:   Basic Metabolic Panel: Recent Labs  Lab 07/16/20 0433 07/20/20 0405  NA 138 136  K 4.5 4.5  CL 97* 95*  CO2 30 29  GLUCOSE 243* 229*  BUN 25* 25*  CREATININE 0.68 0.57  CALCIUM 9.9 9.5  MG  --  2.1    ABG: No results for input(s): PHART, PCO2ART, PO2ART, HCO3, O2SAT in the last 168 hours.  Liver Function Tests: No results for input(s): AST, ALT, ALKPHOS, BILITOT, PROT, ALBUMIN in the last 168 hours. No results for input(s): LIPASE, AMYLASE in the last 168 hours. No results for input(s): AMMONIA in the last 168 hours.  CBC: Recent Labs  Lab 07/16/20 0433 07/20/20 0405  WBC 10.1 7.3  HGB 8.2* 8.0*  HCT 25.3* 24.6*  MCV 108.6* 106.5*  PLT 269 321    Cardiac Enzymes: No results for input(s): CKTOTAL, CKMB, CKMBINDEX, TROPONINI in the last 168 hours.  BNP (last 3 results) No results for input(s): BNP  in the last 8760 hours.  ProBNP (last 3 results) No results for input(s): PROBNP in the last 8760 hours.  Radiological Exams: No results found.  Assessment/Plan Active Problems:   Acute on chronic respiratory failure with hypoxia (HCC)   COVID-19 virus infection   Tracheostomy status (HCC)   Pneumonia due to COVID-19 virus   Acute respiratory distress syndrome (ARDS) due to COVID-19 virus (HCC)   1. Acute on chronic respiratory failure hypoxia we will continue with T collar trials titrate oxygen as tolerated continue pulmonary toilet.  Goal is for 4 hours 2. COVID-19 virus infection recovery 3. Tracheostomy remains in place 4. Pneumonia due to COVID-19 treated we will continue to monitor 5. ARDS supportive care   I have personally seen and evaluated the patient, evaluated laboratory and imaging results, formulated the assessment and plan and placed orders. The Patient requires high complexity decision making with multiple systems involvement.  Rounds were done with the Respiratory Therapy Director and Staff therapists and discussed with nursing staff also.  Yevonne Pax, MD [facility] Pulmonary Critical Care Medicine Sleep Medicine

## 2020-07-22 DIAGNOSIS — U071 COVID-19: Secondary | ICD-10-CM | POA: Diagnosis not present

## 2020-07-22 DIAGNOSIS — Z930 Tracheostomy status: Secondary | ICD-10-CM | POA: Diagnosis not present

## 2020-07-22 DIAGNOSIS — J9621 Acute and chronic respiratory failure with hypoxia: Secondary | ICD-10-CM | POA: Diagnosis not present

## 2020-07-22 DIAGNOSIS — J1282 Pneumonia due to coronavirus disease 2019: Secondary | ICD-10-CM | POA: Diagnosis not present

## 2020-07-22 DIAGNOSIS — J80 Acute respiratory distress syndrome: Secondary | ICD-10-CM | POA: Diagnosis not present

## 2020-07-22 NOTE — Progress Notes (Signed)
Pulmonary Critical Care Medicine [facility] [place]   PULMONARY CRITICAL CARE SERVICE  PROGRESS NOTE  Date of Service: 07/22/2020  Emma Anderson  [id number]:[id number]  DOB: [birth year]   DOA: 06/15/2020  Referring Physician: Carron Curie, MD  HPI: Emma Anderson is a 60 y.o. female seen for follow up of Acute on Chronic Respiratory Failure.  Patient is comfortable right now without distress at this time no fevers are noted remains on the T collar  Medications: Reviewed on Rounds  Physical Exam:  Vitals: Temperature is 96.7 pulse 62 respiratory rate 24 blood pressure is 117/78 saturations 99%  Ventilator Settings on T collar with an FiO2 of 40%  . General: Comfortable at this time . Eyes: Grossly normal lids, irises & conjunctiva . ENT: grossly tongue is normal . Neck: no obvious mass . Cardiovascular: S1 S2 normal no gallop . Respiratory: No rhonchi no rales noted at this time . Abdomen: soft . Skin: no rash seen on limited exam . Musculoskeletal: not rigid . Psychiatric:unable to assess . Neurologic: no seizure no involuntary movements         Lab Data:   Basic Metabolic Panel: Recent Labs  Lab 07/16/20 0433 07/20/20 0405  NA 138 136  K 4.5 4.5  CL 97* 95*  CO2 30 29  GLUCOSE 243* 229*  BUN 25* 25*  CREATININE 0.68 0.57  CALCIUM 9.9 9.5  MG  --  2.1    ABG: No results for input(s): PHART, PCO2ART, PO2ART, HCO3, O2SAT in the last 168 hours.  Liver Function Tests: No results for input(s): AST, ALT, ALKPHOS, BILITOT, PROT, ALBUMIN in the last 168 hours. No results for input(s): LIPASE, AMYLASE in the last 168 hours. No results for input(s): AMMONIA in the last 168 hours.  CBC: Recent Labs  Lab 07/16/20 0433 07/20/20 0405  WBC 10.1 7.3  HGB 8.2* 8.0*  HCT 25.3* 24.6*  MCV 108.6* 106.5*  PLT 269 321    Cardiac Enzymes: No results for input(s): CKTOTAL, CKMB, CKMBINDEX, TROPONINI in the last 168 hours.  BNP (last 3 results) No results for  input(s): BNP in the last 8760 hours.  ProBNP (last 3 results) No results for input(s): PROBNP in the last 8760 hours.  Radiological Exams: No results found.  Assessment/Plan Active Problems:   Acute on chronic respiratory failure with hypoxia (HCC)   COVID-19 virus infection   Tracheostomy status (HCC)   Pneumonia due to COVID-19 virus   Acute respiratory distress syndrome (ARDS) due to COVID-19 virus (HCC)   1. Acute on chronic respiratory failure with hypoxia plan is to continue with the weaning on T collar the goal is for 8 hours today 2. COVID-19 virus infection recovery we will continue to follow 3. Tracheostomy remains in place 4. Pneumonia due to COVID-19 treated 5. ARDS treated we will continue to monitor.   I have personally seen and evaluated the patient, evaluated laboratory and imaging results, formulated the assessment and plan and placed orders. The Patient requires high complexity decision making with multiple systems involvement.  Rounds were done with the Respiratory Therapy Director and Staff therapists and discussed with nursing staff also.  Yevonne Pax, MD [facility] Pulmonary Critical Care Medicine Sleep Medicine

## 2020-07-23 DIAGNOSIS — U071 COVID-19: Secondary | ICD-10-CM | POA: Diagnosis not present

## 2020-07-23 DIAGNOSIS — J1282 Pneumonia due to coronavirus disease 2019: Secondary | ICD-10-CM | POA: Diagnosis not present

## 2020-07-23 DIAGNOSIS — Z930 Tracheostomy status: Secondary | ICD-10-CM | POA: Diagnosis not present

## 2020-07-23 DIAGNOSIS — J80 Acute respiratory distress syndrome: Secondary | ICD-10-CM | POA: Diagnosis not present

## 2020-07-23 DIAGNOSIS — J9621 Acute and chronic respiratory failure with hypoxia: Secondary | ICD-10-CM | POA: Diagnosis not present

## 2020-07-23 LAB — BASIC METABOLIC PANEL
Anion gap: 11 (ref 5–15)
BUN: 29 mg/dL — ABNORMAL HIGH (ref 8–23)
CO2: 29 mmol/L (ref 22–32)
Calcium: 9.6 mg/dL (ref 8.9–10.3)
Chloride: 94 mmol/L — ABNORMAL LOW (ref 98–111)
Creatinine, Ser: 0.59 mg/dL (ref 0.44–1.00)
GFR, Estimated: >60 mL/min (ref >60)
Glucose, Bld: 247 mg/dL — ABNORMAL HIGH (ref 70–99)
Potassium: 4.4 mmol/L (ref 3.5–5.1)
Sodium: 134 mmol/L — ABNORMAL LOW (ref 135–145)

## 2020-07-23 LAB — CBC
HCT: 28 % — ABNORMAL LOW (ref 36.0–46.0)
Hemoglobin: 8.8 g/dL — ABNORMAL LOW (ref 12.0–15.0)
MCH: 33.3 pg (ref 26.0–34.0)
MCHC: 31.4 g/dL (ref 30.0–36.0)
MCV: 106.1 fL — ABNORMAL HIGH (ref 80.0–100.0)
Platelets: 375 10*3/uL (ref 150–400)
RBC: 2.64 MIL/uL — ABNORMAL LOW (ref 3.87–5.11)
RDW: 16.4 % — ABNORMAL HIGH (ref 11.5–15.5)
WBC: 10.9 10*3/uL — ABNORMAL HIGH (ref 4.0–10.5)
nRBC: 0.2 % (ref 0.0–0.2)

## 2020-07-23 LAB — MAGNESIUM: Magnesium: 2.2 mg/dL (ref 1.7–2.4)

## 2020-07-23 NOTE — Progress Notes (Signed)
Pulmonary Critical Care Medicine [facility] [place]   PULMONARY CRITICAL CARE SERVICE  PROGRESS NOTE  Date of Service: 07/23/2020  [name]  BJY:[id number]  DOB: [birth year]   DOA: 06/15/2020  Referring Physician: Carron Curie, MD  HPI: Emma Anderson is a 60 y.o. female seen for follow up of Acute on Chronic Respiratory Failure.  Patient is afebrile on T collar today's goal is for 4-hour  Medications: Reviewed on Rounds  Physical Exam:  Vitals: Temperature is 97.1 pulse 70 respiratory rate is 25 blood pressure 92/64 saturations 98%  Ventilator Settings off the ventilator on T collar FiO2 of 50%  . General: Comfortable at this time . Eyes: Grossly normal lids, irises & conjunctiva . ENT: grossly tongue is normal . Neck: no obvious mass . Cardiovascular: S1 S2 normal no gallop . Respiratory: No rhonchi very coarse breath sounds . Abdomen: soft . Skin: no rash seen on limited exam . Musculoskeletal: not rigid . Psychiatric:unable to assess . Neurologic: no seizure no involuntary movements         Lab Data:   Basic Metabolic Panel: Recent Labs  Lab 07/20/20 0405 07/23/20 0425  NA 136 134*  K 4.5 4.4  CL 95* 94*  CO2 29 29  GLUCOSE 229* 247*  BUN 25* 29*  CREATININE 0.57 0.59  CALCIUM 9.5 9.6  MG 2.1 2.2    ABG: No results for input(s): PHART, PCO2ART, PO2ART, HCO3, O2SAT in the last 168 hours.  Liver Function Tests: No results for input(s): AST, ALT, ALKPHOS, BILITOT, PROT, ALBUMIN in the last 168 hours. No results for input(s): LIPASE, AMYLASE in the last 168 hours. No results for input(s): AMMONIA in the last 168 hours.  CBC: Recent Labs  Lab 07/20/20 0405 07/23/20 0425  WBC 7.3 10.9*  HGB 8.0* 8.8*  HCT 24.6* 28.0*  MCV 106.5* 106.1*  PLT 321 375    Cardiac Enzymes: No results for input(s): CKTOTAL, CKMB, CKMBINDEX, TROPONINI in the last 168 hours.  BNP (last 3 results) No results for input(s): BNP in the last 8760  hours.  ProBNP (last 3 results) No results for input(s): PROBNP in the last 8760 hours.  Radiological Exams: No results found.  Assessment/Plan Active Problems:   Acute on chronic respiratory failure with hypoxia (HCC)   COVID-19 virus infection   Tracheostomy status (HCC)   Pneumonia due to COVID-19 virus   Acute respiratory distress syndrome (ARDS) due to COVID-19 virus (HCC)   1. Acute on chronic respiratory failure hypoxia plan is to continue with weaning on T collar goal is up to 4 hours 2. COVID-19 virus infection recovery we will continue to monitor 3. Tracheostomy remains in place right now 4. Pneumonia due to COVID-19 improving 5. ARDS slow to improve   I have personally seen and evaluated the patient, evaluated laboratory and imaging results, formulated the assessment and plan and placed orders. The Patient requires high complexity decision making with multiple systems involvement.  Rounds were done with the Respiratory Therapy Director and Staff therapists and discussed with nursing staff also.  Yevonne Pax, MD [facility] Pulmonary Critical Care Medicine Sleep Medicine

## 2020-07-24 ENCOUNTER — Other Ambulatory Visit (HOSPITAL_COMMUNITY): Payer: Medicare (Managed Care)

## 2020-07-24 DIAGNOSIS — Z930 Tracheostomy status: Secondary | ICD-10-CM | POA: Diagnosis not present

## 2020-07-24 DIAGNOSIS — J1282 Pneumonia due to coronavirus disease 2019: Secondary | ICD-10-CM | POA: Diagnosis not present

## 2020-07-24 DIAGNOSIS — U071 COVID-19: Secondary | ICD-10-CM | POA: Diagnosis not present

## 2020-07-24 DIAGNOSIS — J80 Acute respiratory distress syndrome: Secondary | ICD-10-CM | POA: Diagnosis not present

## 2020-07-24 DIAGNOSIS — J9621 Acute and chronic respiratory failure with hypoxia: Secondary | ICD-10-CM | POA: Diagnosis not present

## 2020-07-24 NOTE — Progress Notes (Signed)
Pulmonary Critical Care Medicine [facility] [place]   PULMONARY CRITICAL CARE SERVICE  PROGRESS NOTE  Date of Service: 07/24/2020  Dwayne Begay  HKV:[id number]  DOB: 1961/03/12   DOA: 06/15/2020  Referring Physician: Carron Curie, MD  HPI: Emma Anderson is a 60 y.o. female seen for follow up of Acute on Chronic Respiratory Failure.  Patient is going to continue to wean on T collar today the goal is for 24 hours.  Medications: Reviewed on Rounds  Physical Exam:  Vitals: Temperature is 97.1 pulse 71 respiratory 22 blood pressure is 107/65 saturations 96%  Ventilator Settings on T collar FiO2 50%  . General: Comfortable at this time . Eyes: Grossly normal lids, irises & conjunctiva . ENT: grossly tongue is normal . Neck: no obvious mass . Cardiovascular: S1 S2 normal no gallop . Respiratory: No rhonchi no rales noted at this time. . Abdomen: soft . Skin: no rash seen on limited exam . Musculoskeletal: not rigid . Psychiatric:unable to assess . Neurologic: no seizure no involuntary movements         Lab Data:   Basic Metabolic Panel: Recent Labs  Lab 07/20/20 0405 07/23/20 0425  NA 136 134*  K 4.5 4.4  CL 95* 94*  CO2 29 29  GLUCOSE 229* 247*  BUN 25* 29*  CREATININE 0.57 0.59  CALCIUM 9.5 9.6  MG 2.1 2.2    ABG: No results for input(s): PHART, PCO2ART, PO2ART, HCO3, O2SAT in the last 168 hours.  Liver Function Tests: No results for input(s): AST, ALT, ALKPHOS, BILITOT, PROT, ALBUMIN in the last 168 hours. No results for input(s): LIPASE, AMYLASE in the last 168 hours. No results for input(s): AMMONIA in the last 168 hours.  CBC: Recent Labs  Lab 07/20/20 0405 07/23/20 0425  WBC 7.3 10.9*  HGB 8.0* 8.8*  HCT 24.6* 28.0*  MCV 106.5* 106.1*  PLT 321 375    Cardiac Enzymes: No results for input(s): CKTOTAL, CKMB, CKMBINDEX, TROPONINI in the last 168 hours.  BNP (last 3 results) No results for input(s): BNP in the last 8760  hours.  ProBNP (last 3 results) No results for input(s): PROBNP in the last 8760 hours.  Radiological Exams: DG CHEST PORT 1 VIEW  Result Date: 07/24/2020 CLINICAL DATA:  Pneumonia. EXAM: PORTABLE CHEST 1 VIEW COMPARISON:  July 16, 2020. FINDINGS: Stable cardiomegaly. Tracheostomy tube is unchanged in position. No pneumothorax is noted. No significant pleural effusion is noted. Stable bibasilar opacities are noted concerning for atelectasis or infiltrates. Bony thorax is unremarkable. IMPRESSION: Stable bibasilar opacities are noted concerning for atelectasis or infiltrates. Electronically Signed   By: Lupita Raider M.D.   On: 07/24/2020 09:34    Assessment/Plan Active Problems:   Acute on chronic respiratory failure with hypoxia (HCC)   COVID-19 virus infection   Tracheostomy status (HCC)   Pneumonia due to COVID-19 virus   Acute respiratory distress syndrome (ARDS) due to COVID-19 virus (HCC)   1. Acute on chronic respiratory failure hypoxia we will continue with T collar trials titrate as tolerated continue secretion management supportive care. 2. Pneumonia due to COVID-19 treated improving 3. COVID-19 virus infection resolution 4. ARDS treated slow improvement we will continue to follow along.   I have personally seen and evaluated the patient, evaluated laboratory and imaging results, formulated the assessment and plan and placed orders. The Patient requires high complexity decision making with multiple systems involvement.  Rounds were done with the Respiratory Therapy Director and Staff therapists and discussed with nursing  staff also.  Allyne Gee, MD [facility] Pulmonary Critical Care Medicine Sleep Medicine

## 2020-07-25 DIAGNOSIS — U071 COVID-19: Secondary | ICD-10-CM | POA: Diagnosis not present

## 2020-07-25 DIAGNOSIS — J80 Acute respiratory distress syndrome: Secondary | ICD-10-CM | POA: Diagnosis not present

## 2020-07-25 DIAGNOSIS — J1282 Pneumonia due to coronavirus disease 2019: Secondary | ICD-10-CM | POA: Diagnosis not present

## 2020-07-25 DIAGNOSIS — J9621 Acute and chronic respiratory failure with hypoxia: Secondary | ICD-10-CM | POA: Diagnosis not present

## 2020-07-25 DIAGNOSIS — Z930 Tracheostomy status: Secondary | ICD-10-CM | POA: Diagnosis not present

## 2020-07-25 LAB — PHOSPHORUS: Phosphorus: 3.9 mg/dL (ref 2.5–4.6)

## 2020-07-25 LAB — MAGNESIUM: Magnesium: 2.1 mg/dL (ref 1.7–2.4)

## 2020-07-25 LAB — CBC
HCT: 27.7 % — ABNORMAL LOW (ref 36.0–46.0)
Hemoglobin: 9.1 g/dL — ABNORMAL LOW (ref 12.0–15.0)
MCH: 33.7 pg (ref 26.0–34.0)
MCHC: 32.9 g/dL (ref 30.0–36.0)
MCV: 102.6 fL — ABNORMAL HIGH (ref 80.0–100.0)
Platelets: 317 10*3/uL (ref 150–400)
RBC: 2.7 MIL/uL — ABNORMAL LOW (ref 3.87–5.11)
RDW: 16 % — ABNORMAL HIGH (ref 11.5–15.5)
WBC: 5 10*3/uL (ref 4.0–10.5)
nRBC: 0 % (ref 0.0–0.2)

## 2020-07-25 LAB — BASIC METABOLIC PANEL
Anion gap: 9 (ref 5–15)
BUN: 29 mg/dL — ABNORMAL HIGH (ref 8–23)
CO2: 30 mmol/L (ref 22–32)
Calcium: 9.5 mg/dL (ref 8.9–10.3)
Chloride: 96 mmol/L — ABNORMAL LOW (ref 98–111)
Creatinine, Ser: 0.57 mg/dL (ref 0.44–1.00)
GFR, Estimated: >60 mL/min (ref >60)
Glucose, Bld: 135 mg/dL — ABNORMAL HIGH (ref 70–99)
Potassium: 3.8 mmol/L (ref 3.5–5.1)
Sodium: 135 mmol/L (ref 135–145)

## 2020-07-25 NOTE — Progress Notes (Signed)
Pulmonary Critical Care Medicine [facility] [place]   PULMONARY CRITICAL CARE SERVICE  PROGRESS NOTE  Date of Service: 07/25/2020  Keiarra Charon  JXB:[id number]  DOB: 07/19/1960   DOA: 06/15/2020  Referring Physician: Carron Curie, MD  HPI: Valeree Leidy is a 60 y.o. female seen for follow up of Acute on Chronic Respiratory Failure.  She is doing well off the ventilator has been on T collar  Medications: Reviewed on Rounds  Physical Exam:  Vitals: Temperature is 97.7 pulse 100 respiratory 20 blood pressure is 115/76 saturations 99%  Ventilator Settings off the ventilator on T collar today will be 24 hours to 48 hours  . General: Comfortable at this time . Eyes: Grossly normal lids, irises & conjunctiva . ENT: grossly tongue is normal . Neck: no obvious mass . Cardiovascular: S1 S2 normal no gallop . Respiratory: No rhonchi no rales . Abdomen: soft . Skin: no rash seen on limited exam . Musculoskeletal: not rigid . Psychiatric:unable to assess . Neurologic: no seizure no involuntary movements         Lab Data:   Basic Metabolic Panel: Recent Labs  Lab 07/20/20 0405 07/23/20 0425 07/25/20 0527  NA 136 134* 135  K 4.5 4.4 3.8  CL 95* 94* 96*  CO2 29 29 30   GLUCOSE 229* 247* 135*  BUN 25* 29* 29*  CREATININE 0.57 0.59 0.57  CALCIUM 9.5 9.6 9.5  MG 2.1 2.2 2.1  PHOS  --   --  3.9    ABG: No results for input(s): PHART, PCO2ART, PO2ART, HCO3, O2SAT in the last 168 hours.  Liver Function Tests: No results for input(s): AST, ALT, ALKPHOS, BILITOT, PROT, ALBUMIN in the last 168 hours. No results for input(s): LIPASE, AMYLASE in the last 168 hours. No results for input(s): AMMONIA in the last 168 hours.  CBC: Recent Labs  Lab 07/20/20 0405 07/23/20 0425 07/25/20 0527  WBC 7.3 10.9* 5.0  HGB 8.0* 8.8* 9.1*  HCT 24.6* 28.0* 27.7*  MCV 106.5* 106.1* 102.6*  PLT 321 375 317    Cardiac Enzymes: No results for input(s): CKTOTAL, CKMB,  CKMBINDEX, TROPONINI in the last 168 hours.  BNP (last 3 results) No results for input(s): BNP in the last 8760 hours.  ProBNP (last 3 results) No results for input(s): PROBNP in the last 8760 hours.  Radiological Exams: DG CHEST PORT 1 VIEW  Result Date: 07/24/2020 CLINICAL DATA:  Pneumonia. EXAM: PORTABLE CHEST 1 VIEW COMPARISON:  July 16, 2020. FINDINGS: Stable cardiomegaly. Tracheostomy tube is unchanged in position. No pneumothorax is noted. No significant pleural effusion is noted. Stable bibasilar opacities are noted concerning for atelectasis or infiltrates. Bony thorax is unremarkable. IMPRESSION: Stable bibasilar opacities are noted concerning for atelectasis or infiltrates. Electronically Signed   By: July 18, 2020 M.D.   On: 07/24/2020 09:34    Assessment/Plan Active Problems:   Acute on chronic respiratory failure with hypoxia (HCC)   COVID-19 virus infection   Tracheostomy status (HCC)   Pneumonia due to COVID-19 virus   Acute respiratory distress syndrome (ARDS) due to COVID-19 virus (HCC)   1. Acute on chronic respiratory failure with hypoxia we will continue to wean to 48 hours 2. COVID-19 virus infection recovery 3. Tracheostomy remains in place 4. Pneumonia due to COVID-19 slowly improving 5. ARDS improving   I have personally seen and evaluated the patient, evaluated laboratory and imaging results, formulated the assessment and plan and placed orders. The Patient requires high complexity decision making with multiple  systems involvement.  Rounds were done with the Respiratory Therapy Director and Staff therapists and discussed with nursing staff also.  Allyne Gee, MD [facility] Pulmonary Critical Care Medicine Sleep Medicine

## 2020-07-26 DIAGNOSIS — Z930 Tracheostomy status: Secondary | ICD-10-CM | POA: Diagnosis not present

## 2020-07-26 DIAGNOSIS — J9621 Acute and chronic respiratory failure with hypoxia: Secondary | ICD-10-CM | POA: Diagnosis not present

## 2020-07-26 DIAGNOSIS — J80 Acute respiratory distress syndrome: Secondary | ICD-10-CM | POA: Diagnosis not present

## 2020-07-26 DIAGNOSIS — J1282 Pneumonia due to coronavirus disease 2019: Secondary | ICD-10-CM | POA: Diagnosis not present

## 2020-07-26 DIAGNOSIS — U071 COVID-19: Secondary | ICD-10-CM | POA: Diagnosis not present

## 2020-07-26 NOTE — Progress Notes (Signed)
Pulmonary Critical Care Medicine [facility] [place]   PULMONARY CRITICAL CARE SERVICE  PROGRESS NOTE  Date of Service: 07/26/2020  Emma Anderson  QQP:[id number]  DOB: 07-01-1960   DOA: 06/15/2020  Referring Physician: Carron Curie, MD  HPI: Emma Anderson is a 60 y.o. female seen for follow up of Acute on Chronic Respiratory Failure.  Patient is afebrile right now comfortable without distress has been on T collar  Medications: Reviewed on Rounds  Physical Exam:  Vitals: Temperature is 97 pulse 65 respiratory 22 blood pressure is 128/78 saturations 100%  Ventilator Settings on T collar off the ventilator  . General: Comfortable at this time . Eyes: Grossly normal lids, irises & conjunctiva . ENT: grossly tongue is normal . Neck: no obvious mass . Cardiovascular: S1 S2 normal no gallop . Respiratory: No rhonchi no rales noted at this time . Abdomen: soft . Skin: no rash seen on limited exam . Musculoskeletal: not rigid . Psychiatric:unable to assess . Neurologic: no seizure no involuntary movements         Lab Data:   Basic Metabolic Panel: Recent Labs  Lab 07/20/20 0405 07/23/20 0425 07/25/20 0527  NA 136 134* 135  K 4.5 4.4 3.8  CL 95* 94* 96*  CO2 29 29 30   GLUCOSE 229* 247* 135*  BUN 25* 29* 29*  CREATININE 0.57 0.59 0.57  CALCIUM 9.5 9.6 9.5  MG 2.1 2.2 2.1  PHOS  --   --  3.9    ABG: No results for input(s): PHART, PCO2ART, PO2ART, HCO3, O2SAT in the last 168 hours.  Liver Function Tests: No results for input(s): AST, ALT, ALKPHOS, BILITOT, PROT, ALBUMIN in the last 168 hours. No results for input(s): LIPASE, AMYLASE in the last 168 hours. No results for input(s): AMMONIA in the last 168 hours.  CBC: Recent Labs  Lab 07/20/20 0405 07/23/20 0425 07/25/20 0527  WBC 7.3 10.9* 5.0  HGB 8.0* 8.8* 9.1*  HCT 24.6* 28.0* 27.7*  MCV 106.5* 106.1* 102.6*  PLT 321 375 317    Cardiac Enzymes: No results for input(s): CKTOTAL, CKMB,  CKMBINDEX, TROPONINI in the last 168 hours.  BNP (last 3 results) No results for input(s): BNP in the last 8760 hours.  ProBNP (last 3 results) No results for input(s): PROBNP in the last 8760 hours.  Radiological Exams: No results found.  Assessment/Plan Active Problems:   Acute on chronic respiratory failure with hypoxia (HCC)   COVID-19 virus infection   Tracheostomy status (HCC)   Pneumonia due to COVID-19 virus   Acute respiratory distress syndrome (ARDS) due to COVID-19 virus (HCC)   1. Acute on chronic respiratory failure hypoxia we will continue with the T collar weaning 2. Tracheostomy remains in place 3. Pneumonia due to COVID-19 treated slowly improving 4. ARDS also improving we will continue to monitor   I have personally seen and evaluated the patient, evaluated laboratory and imaging results, formulated the assessment and plan and placed orders. The Patient requires high complexity decision making with multiple systems involvement.  Rounds were done with the Respiratory Therapy Director and Staff therapists and discussed with nursing staff also.  09/24/20, MD [facility] Pulmonary Critical Care Medicine Sleep Medicine

## 2020-07-27 DIAGNOSIS — U071 COVID-19: Secondary | ICD-10-CM | POA: Diagnosis not present

## 2020-07-27 DIAGNOSIS — J1282 Pneumonia due to coronavirus disease 2019: Secondary | ICD-10-CM | POA: Diagnosis not present

## 2020-07-27 DIAGNOSIS — J80 Acute respiratory distress syndrome: Secondary | ICD-10-CM | POA: Diagnosis not present

## 2020-07-27 DIAGNOSIS — J9621 Acute and chronic respiratory failure with hypoxia: Secondary | ICD-10-CM | POA: Diagnosis not present

## 2020-07-27 DIAGNOSIS — Z930 Tracheostomy status: Secondary | ICD-10-CM | POA: Diagnosis not present

## 2020-07-27 NOTE — Progress Notes (Signed)
Pulmonary Critical Care Medicine [facility] [place]   PULMONARY CRITICAL CARE SERVICE  PROGRESS NOTE  Date of Service: 07/27/2020  Emma Anderson  [id number]:[id number]  DOB: 13-Jul-1960   DOA: 06/15/2020  Referring Physician: Carron Curie, MD  HPI: Emma Anderson is a 60 y.o. female seen for follow up of Acute on Chronic Respiratory Failure.  Currently on T collar has been on 40% FiO2  Medications: Reviewed on Rounds  Physical Exam:  Vitals: Temperature is 96.4 pulse 76 respiratory 25 blood pressure is 155/79 saturations 97%  Ventilator Settings on T collar with an FiO2 of 40%  . General: Comfortable at this time . Eyes: Grossly normal lids, irises & conjunctiva . ENT: grossly tongue is normal . Neck: no obvious mass . Cardiovascular: S1 S2 normal no gallop . Respiratory: Scattered rhonchi expansion is equal at this time . Abdomen: soft . Skin: no rash seen on limited exam . Musculoskeletal: not rigid . Psychiatric:unable to assess . Neurologic: no seizure no involuntary movements         Lab Data:   Basic Metabolic Panel: Recent Labs  Lab 07/23/20 0425 07/25/20 0527  NA 134* 135  K 4.4 3.8  CL 94* 96*  CO2 29 30  GLUCOSE 247* 135*  BUN 29* 29*  CREATININE 0.59 0.57  CALCIUM 9.6 9.5  MG 2.2 2.1  PHOS  --  3.9    ABG: No results for input(s): PHART, PCO2ART, PO2ART, HCO3, O2SAT in the last 168 hours.  Liver Function Tests: No results for input(s): AST, ALT, ALKPHOS, BILITOT, PROT, ALBUMIN in the last 168 hours. No results for input(s): LIPASE, AMYLASE in the last 168 hours. No results for input(s): AMMONIA in the last 168 hours.  CBC: Recent Labs  Lab 07/23/20 0425 07/25/20 0527  WBC 10.9* 5.0  HGB 8.8* 9.1*  HCT 28.0* 27.7*  MCV 106.1* 102.6*  PLT 375 317    Cardiac Enzymes: No results for input(s): CKTOTAL, CKMB, CKMBINDEX, TROPONINI in the last 168 hours.  BNP (last 3 results) No results for input(s): BNP in the last 8760  hours.  ProBNP (last 3 results) No results for input(s): PROBNP in the last 8760 hours.  Radiological Exams: No results found.  Assessment/Plan Active Problems:   Acute on chronic respiratory failure with hypoxia (HCC)   COVID-19 virus infection   Tracheostomy status (HCC)   Pneumonia due to COVID-19 virus   Acute respiratory distress syndrome (ARDS) due to COVID-19 virus (HCC)   1. Acute on chronic respiratory failure with hypoxia on 40% at this time plan is to continue with T collar. 2. COVID-19 virus infection recovery phase 3. Tracheostomy remains in place 4. Pneumonia due to COVID-19 treated 5. ARDS supportive care   I have personally seen and evaluated the patient, evaluated laboratory and imaging results, formulated the assessment and plan and placed orders. The Patient requires high complexity decision making with multiple systems involvement.  Rounds were done with the Respiratory Therapy Director and Staff therapists and discussed with nursing staff also.  Yevonne Pax, MD [facility] Pulmonary Critical Care Medicine Sleep Medicine

## 2020-07-28 DIAGNOSIS — J80 Acute respiratory distress syndrome: Secondary | ICD-10-CM | POA: Diagnosis not present

## 2020-07-28 DIAGNOSIS — J1282 Pneumonia due to coronavirus disease 2019: Secondary | ICD-10-CM | POA: Diagnosis not present

## 2020-07-28 DIAGNOSIS — U071 COVID-19: Secondary | ICD-10-CM | POA: Diagnosis not present

## 2020-07-28 DIAGNOSIS — Z930 Tracheostomy status: Secondary | ICD-10-CM | POA: Diagnosis not present

## 2020-07-28 DIAGNOSIS — J9621 Acute and chronic respiratory failure with hypoxia: Secondary | ICD-10-CM | POA: Diagnosis not present

## 2020-07-28 LAB — BASIC METABOLIC PANEL
Anion gap: 8 (ref 5–15)
BUN: 24 mg/dL — ABNORMAL HIGH (ref 8–23)
CO2: 30 mmol/L (ref 22–32)
Calcium: 9.4 mg/dL (ref 8.9–10.3)
Chloride: 97 mmol/L — ABNORMAL LOW (ref 98–111)
Creatinine, Ser: 0.5 mg/dL (ref 0.44–1.00)
GFR, Estimated: >60 mL/min (ref >60)
Glucose, Bld: 115 mg/dL — ABNORMAL HIGH (ref 70–99)
Potassium: 3.9 mmol/L (ref 3.5–5.1)
Sodium: 135 mmol/L (ref 135–145)

## 2020-07-28 LAB — CBC
HCT: 27.3 % — ABNORMAL LOW (ref 36.0–46.0)
Hemoglobin: 9.1 g/dL — ABNORMAL LOW (ref 12.0–15.0)
MCH: 34.1 pg — ABNORMAL HIGH (ref 26.0–34.0)
MCHC: 33.3 g/dL (ref 30.0–36.0)
MCV: 102.2 fL — ABNORMAL HIGH (ref 80.0–100.0)
Platelets: 247 10*3/uL (ref 150–400)
RBC: 2.67 MIL/uL — ABNORMAL LOW (ref 3.87–5.11)
RDW: 15.8 % — ABNORMAL HIGH (ref 11.5–15.5)
WBC: 6 10*3/uL (ref 4.0–10.5)
nRBC: 0 % (ref 0.0–0.2)

## 2020-07-28 LAB — PHOSPHORUS: Phosphorus: 4.6 mg/dL (ref 2.5–4.6)

## 2020-07-28 LAB — MAGNESIUM: Magnesium: 1.9 mg/dL (ref 1.7–2.4)

## 2020-07-28 NOTE — Progress Notes (Signed)
Pulmonary Critical Care Medicine [facility] [place]   PULMONARY CRITICAL CARE SERVICE  PROGRESS NOTE  Date of Service: 07/28/2020  Emma Anderson  [id number]:[id number]  DOB: 12/12/1960   DOA: 06/15/2020  Referring Physician: Carron Curie, MD  HPI: Emma Anderson is a 60 y.o. female seen for follow up of Acute on Chronic Respiratory Failure.  Patient is on T collar has been on 35% FiO2 with good saturation  Medications: Reviewed on Rounds  Physical Exam:  Vitals: Temperature is 98.5 pulse 65 respiratory 21 blood pressure is 120/67 saturations 98%  Ventilator Settings on T collar with an FiO2 of 35%  . General: Comfortable at this time . Eyes: Grossly normal lids, irises & conjunctiva . ENT: grossly tongue is normal . Neck: no obvious mass . Cardiovascular: S1 S2 normal no gallop . Respiratory: Scattered rhonchi expansion is equal . Abdomen: soft . Skin: no rash seen on limited exam . Musculoskeletal: not rigid . Psychiatric:unable to assess . Neurologic: no seizure no involuntary movements         Lab Data:   Basic Metabolic Panel: Recent Labs  Lab 07/23/20 0425 07/25/20 0527 07/28/20 0416  NA 134* 135 135  K 4.4 3.8 3.9  CL 94* 96* 97*  CO2 29 30 30   GLUCOSE 247* 135* 115*  BUN 29* 29* 24*  CREATININE 0.59 0.57 0.50  CALCIUM 9.6 9.5 9.4  MG 2.2 2.1 1.9  PHOS  --  3.9 4.6    ABG: No results for input(s): PHART, PCO2ART, PO2ART, HCO3, O2SAT in the last 168 hours.  Liver Function Tests: No results for input(s): AST, ALT, ALKPHOS, BILITOT, PROT, ALBUMIN in the last 168 hours. No results for input(s): LIPASE, AMYLASE in the last 168 hours. No results for input(s): AMMONIA in the last 168 hours.  CBC: Recent Labs  Lab 07/23/20 0425 07/25/20 0527 07/28/20 0416  WBC 10.9* 5.0 6.0  HGB 8.8* 9.1* 9.1*  HCT 28.0* 27.7* 27.3*  MCV 106.1* 102.6* 102.2*  PLT 375 317 247    Cardiac Enzymes: No results for input(s): CKTOTAL, CKMB, CKMBINDEX,  TROPONINI in the last 168 hours.  BNP (last 3 results) No results for input(s): BNP in the last 8760 hours.  ProBNP (last 3 results) No results for input(s): PROBNP in the last 8760 hours.  Radiological Exams: No results found.  Assessment/Plan Active Problems:   Acute on chronic respiratory failure with hypoxia (HCC)   COVID-19 virus infection   Tracheostomy status (HCC)   Pneumonia due to COVID-19 virus   Acute respiratory distress syndrome (ARDS) due to COVID-19 virus (HCC)   1. Acute on chronic respiratory failure hypoxia we will continue T collar on 35% FiO2 good saturations 2. COVID-19 virus infection recovery 3. Tracheostomy remains in place 4. Pneumonia due to COVID-19 treated slowly improving 5. ARDS treated slow improvement   I have personally seen and evaluated the patient, evaluated laboratory and imaging results, formulated the assessment and plan and placed orders. The Patient requires high complexity decision making with multiple systems involvement.  Rounds were done with the Respiratory Therapy Director and Staff therapists and discussed with nursing staff also.  09/27/20, MD [facility] Pulmonary Critical Care Medicine Sleep Medicine

## 2020-07-30 ENCOUNTER — Other Ambulatory Visit (HOSPITAL_COMMUNITY): Payer: Medicare (Managed Care)

## 2020-07-31 ENCOUNTER — Other Ambulatory Visit (HOSPITAL_COMMUNITY): Payer: Medicare (Managed Care)

## 2020-07-31 DIAGNOSIS — J1282 Pneumonia due to coronavirus disease 2019: Secondary | ICD-10-CM | POA: Diagnosis not present

## 2020-07-31 DIAGNOSIS — J9621 Acute and chronic respiratory failure with hypoxia: Secondary | ICD-10-CM | POA: Diagnosis not present

## 2020-07-31 DIAGNOSIS — Z930 Tracheostomy status: Secondary | ICD-10-CM | POA: Diagnosis not present

## 2020-07-31 DIAGNOSIS — U071 COVID-19: Secondary | ICD-10-CM | POA: Diagnosis not present

## 2020-07-31 DIAGNOSIS — J80 Acute respiratory distress syndrome: Secondary | ICD-10-CM | POA: Diagnosis not present

## 2020-07-31 NOTE — Progress Notes (Signed)
Pulmonary Critical Care Medicine [facility] [place]   PULMONARY CRITICAL CARE SERVICE  PROGRESS NOTE  Date of Service: 07/31/2020  Emma Anderson  OEU:[id number]  DOB: 1961-05-05   DOA: 06/15/2020  Referring Physician: Carron Curie, MD  HPI: Emma Anderson is a 60 y.o. female seen for follow up of Acute on Chronic Respiratory Failure.  Remains off the ventilator she is on T collar on 35% FiO2 using PMV  Medications: Reviewed on Rounds  Physical Exam:  Vitals: Temperature is 96.8 pulse 77 respiratory 19 blood pressure is one 2/62 saturations 92%  Ventilator Settings off the ventilator currently on T collar FiO2 35%  . General: Comfortable at this time . Eyes: Grossly normal lids, irises & conjunctiva . ENT: grossly tongue is normal . Neck: no obvious mass . Cardiovascular: S1 S2 normal no gallop . Respiratory: Scattered rhonchi expansion is equal . Abdomen: soft . Skin: no rash seen on limited exam . Musculoskeletal: not rigid . Psychiatric:unable to assess . Neurologic: no seizure no involuntary movements         Lab Data:   Basic Metabolic Panel: Recent Labs  Lab 07/25/20 0527 07/28/20 0416  NA 135 135  K 3.8 3.9  CL 96* 97*  CO2 30 30  GLUCOSE 135* 115*  BUN 29* 24*  CREATININE 0.57 0.50  CALCIUM 9.5 9.4  MG 2.1 1.9  PHOS 3.9 4.6    ABG: No results for input(s): PHART, PCO2ART, PO2ART, HCO3, O2SAT in the last 168 hours.  Liver Function Tests: No results for input(s): AST, ALT, ALKPHOS, BILITOT, PROT, ALBUMIN in the last 168 hours. No results for input(s): LIPASE, AMYLASE in the last 168 hours. No results for input(s): AMMONIA in the last 168 hours.  CBC: Recent Labs  Lab 07/25/20 0527 07/28/20 0416  WBC 5.0 6.0  HGB 9.1* 9.1*  HCT 27.7* 27.3*  MCV 102.6* 102.2*  PLT 317 247    Cardiac Enzymes: No results for input(s): CKTOTAL, CKMB, CKMBINDEX, TROPONINI in the last 168 hours.  BNP (last 3 results) No results for input(s): BNP  in the last 8760 hours.  ProBNP (last 3 results) No results for input(s): PROBNP in the last 8760 hours.  Radiological Exams: No results found.  Assessment/Plan Active Problems:   Acute on chronic respiratory failure with hypoxia (HCC)   COVID-19 virus infection   Tracheostomy status (HCC)   Pneumonia due to COVID-19 virus   Acute respiratory distress syndrome (ARDS) due to COVID-19 virus (HCC)   1. Acute on chronic respiratory failure hypoxia plan is to continue with the T-piece wean use the PMV respiratory therapy will assess readiness for capping 2. COVID-19 virus infection recovery phase 3. Tracheostomy doing fine with PMV 4. Pneumonia treated 5. ARDS supportive care we will continue to follow   I have personally seen and evaluated the patient, evaluated laboratory and imaging results, formulated the assessment and plan and placed orders. The Patient requires high complexity decision making with multiple systems involvement.  Rounds were done with the Respiratory Therapy Director and Staff therapists and discussed with nursing staff also.  Yevonne Pax, MD [facility] Pulmonary Critical Care Medicine Sleep Medicine

## 2020-08-01 DIAGNOSIS — Z930 Tracheostomy status: Secondary | ICD-10-CM | POA: Diagnosis not present

## 2020-08-01 DIAGNOSIS — U071 COVID-19: Secondary | ICD-10-CM | POA: Diagnosis not present

## 2020-08-01 DIAGNOSIS — J80 Acute respiratory distress syndrome: Secondary | ICD-10-CM | POA: Diagnosis not present

## 2020-08-01 DIAGNOSIS — J9621 Acute and chronic respiratory failure with hypoxia: Secondary | ICD-10-CM | POA: Diagnosis not present

## 2020-08-01 DIAGNOSIS — J1282 Pneumonia due to coronavirus disease 2019: Secondary | ICD-10-CM | POA: Diagnosis not present

## 2020-08-01 LAB — BASIC METABOLIC PANEL
Anion gap: 10 (ref 5–15)
BUN: 21 mg/dL (ref 8–23)
CO2: 28 mmol/L (ref 22–32)
Calcium: 9.6 mg/dL (ref 8.9–10.3)
Chloride: 97 mmol/L — ABNORMAL LOW (ref 98–111)
Creatinine, Ser: 0.57 mg/dL (ref 0.44–1.00)
GFR, Estimated: >60 mL/min (ref >60)
Glucose, Bld: 91 mg/dL (ref 70–99)
Potassium: 3.8 mmol/L (ref 3.5–5.1)
Sodium: 135 mmol/L (ref 135–145)

## 2020-08-01 LAB — CBC
HCT: 26.8 % — ABNORMAL LOW (ref 36.0–46.0)
Hemoglobin: 8.6 g/dL — ABNORMAL LOW (ref 12.0–15.0)
MCH: 33.2 pg (ref 26.0–34.0)
MCHC: 32.1 g/dL (ref 30.0–36.0)
MCV: 103.5 fL — ABNORMAL HIGH (ref 80.0–100.0)
Platelets: 206 10*3/uL (ref 150–400)
RBC: 2.59 MIL/uL — ABNORMAL LOW (ref 3.87–5.11)
RDW: 15.4 % (ref 11.5–15.5)
WBC: 6 10*3/uL (ref 4.0–10.5)
nRBC: 0 % (ref 0.0–0.2)

## 2020-08-01 LAB — MAGNESIUM: Magnesium: 1.9 mg/dL (ref 1.7–2.4)

## 2020-08-01 NOTE — Progress Notes (Signed)
Pulmonary Critical Care Medicine [facility] [place]   PULMONARY CRITICAL CARE SERVICE  PROGRESS NOTE  Date of Service: 08/01/2020  Emma Anderson  [id number]:[id number]  DOB: [birth year]   DOA: 06/15/2020  Referring Physician: Carron Curie, MD  HPI: Emma Anderson is a 60 y.o. female seen for follow up of Acute on Chronic Respiratory Failure.  Patient is on T collar currently on 35% FiO2 status post trach changed out today  Medications: Reviewed on Rounds  Physical Exam:  Vitals: Temperature 97.1 pulse 66 respiratory rate is 26 blood pressure 99/55 saturations 97%  Ventilator Settings on T collar with an FiO2 of 35%  . General: Comfortable at this time . Eyes: Grossly normal lids, irises & conjunctiva . ENT: grossly tongue is normal . Neck: no obvious mass . Cardiovascular: S1 S2 normal no gallop . Respiratory: No rhonchi no rales . Abdomen: soft . Skin: no rash seen on limited exam . Musculoskeletal: not rigid . Psychiatric:unable to assess . Neurologic: no seizure no involuntary movements         Lab Data:   Basic Metabolic Panel: Recent Labs  Lab 07/28/20 0416 08/01/20 0414  NA 135 135  K 3.9 3.8  CL 97* 97*  CO2 30 28  GLUCOSE 115* 91  BUN 24* 21  CREATININE 0.50 0.57  CALCIUM 9.4 9.6  MG 1.9 1.9  PHOS 4.6  --     ABG: No results for input(s): PHART, PCO2ART, PO2ART, HCO3, O2SAT in the last 168 hours.  Liver Function Tests: No results for input(s): AST, ALT, ALKPHOS, BILITOT, PROT, ALBUMIN in the last 168 hours. No results for input(s): LIPASE, AMYLASE in the last 168 hours. No results for input(s): AMMONIA in the last 168 hours.  CBC: Recent Labs  Lab 07/28/20 0416 08/01/20 0414  WBC 6.0 6.0  HGB 9.1* 8.6*  HCT 27.3* 26.8*  MCV 102.2* 103.5*  PLT 247 206    Cardiac Enzymes: No results for input(s): CKTOTAL, CKMB, CKMBINDEX, TROPONINI in the last 168 hours.  BNP (last 3 results) No results for input(s): BNP in the last 8760  hours.  ProBNP (last 3 results) No results for input(s): PROBNP in the last 8760 hours.  Radiological Exams: No results found.  Assessment/Plan Active Problems:   Acute on chronic respiratory failure with hypoxia (HCC)   COVID-19 virus infection   Tracheostomy status (HCC)   Pneumonia due to COVID-19 virus   Acute respiratory distress syndrome (ARDS) due to COVID-19 virus (HCC)   1. Acute on chronic respiratory failure hypoxia we will proceed to trach change to a cuffless #6 and begin capping 2. COVID-19 virus infection recovery phase will continue to follow along. 3. Tracheostomy will be changed out as discussed. 4. Pneumonia due to COVID-19 treated we will continue to follow along. 5. ARDS slow improvement we will continue with supportive care   I have personally seen and evaluated the patient, evaluated laboratory and imaging results, formulated the assessment and plan and placed orders. The Patient requires high complexity decision making with multiple systems involvement.  Rounds were done with the Respiratory Therapy Director and Staff therapists and discussed with nursing staff also.  Yevonne Pax, MD [facility] Pulmonary Critical Care Medicine Sleep Medicine

## 2020-08-02 DIAGNOSIS — Z930 Tracheostomy status: Secondary | ICD-10-CM | POA: Diagnosis not present

## 2020-08-02 DIAGNOSIS — J9621 Acute and chronic respiratory failure with hypoxia: Secondary | ICD-10-CM | POA: Diagnosis not present

## 2020-08-02 DIAGNOSIS — J80 Acute respiratory distress syndrome: Secondary | ICD-10-CM | POA: Diagnosis not present

## 2020-08-02 DIAGNOSIS — U071 COVID-19: Secondary | ICD-10-CM | POA: Diagnosis not present

## 2020-08-02 DIAGNOSIS — J1282 Pneumonia due to coronavirus disease 2019: Secondary | ICD-10-CM | POA: Diagnosis not present

## 2020-08-02 NOTE — Progress Notes (Signed)
Pulmonary Critical Care Medicine [facility] [place]   PULMONARY CRITICAL CARE SERVICE  PROGRESS NOTE  Date of Service: 08/02/2020  Emma Anderson  MCN:[id number]  DOB: 07-Aug-1960   DOA: 06/15/2020  Referring Physician: Carron Curie, MD  HPI: Emma Anderson is a 60 y.o. female seen for follow up of Acute on Chronic Respiratory Failure.  Patient is capping right now on 2 L looks good this morning  Medications: Reviewed on Rounds  Physical Exam:  Vitals: Temperature is 96.7 pulse 84 respiratory 22 blood pressure 138/89 saturations 96%  Ventilator Settings capping on 2 L  . General: Comfortable at this time . Eyes: Grossly normal lids, irises & conjunctiva . ENT: grossly tongue is normal . Neck: no obvious mass . Cardiovascular: S1 S2 normal no gallop . Respiratory: No rhonchi no rales noted at this time . Abdomen: soft . Skin: no rash seen on limited exam . Musculoskeletal: not rigid . Psychiatric:unable to assess . Neurologic: no seizure no involuntary movements         Lab Data:   Basic Metabolic Panel: Recent Labs  Lab 07/28/20 0416 08/01/20 0414  NA 135 135  K 3.9 3.8  CL 97* 97*  CO2 30 28  GLUCOSE 115* 91  BUN 24* 21  CREATININE 0.50 0.57  CALCIUM 9.4 9.6  MG 1.9 1.9  PHOS 4.6  --     ABG: No results for input(s): PHART, PCO2ART, PO2ART, HCO3, O2SAT in the last 168 hours.  Liver Function Tests: No results for input(s): AST, ALT, ALKPHOS, BILITOT, PROT, ALBUMIN in the last 168 hours. No results for input(s): LIPASE, AMYLASE in the last 168 hours. No results for input(s): AMMONIA in the last 168 hours.  CBC: Recent Labs  Lab 07/28/20 0416 08/01/20 0414  WBC 6.0 6.0  HGB 9.1* 8.6*  HCT 27.3* 26.8*  MCV 102.2* 103.5*  PLT 247 206    Cardiac Enzymes: No results for input(s): CKTOTAL, CKMB, CKMBINDEX, TROPONINI in the last 168 hours.  BNP (last 3 results) No results for input(s): BNP in the last 8760 hours.  ProBNP (last 3  results) No results for input(s): PROBNP in the last 8760 hours.  Radiological Exams: No results found.  Assessment/Plan Active Problems:   Acute on chronic respiratory failure with hypoxia (HCC)   COVID-19 virus infection   Tracheostomy status (HCC)   Pneumonia due to COVID-19 virus   Acute respiratory distress syndrome (ARDS) due to COVID-19 virus (HCC)   1. Acute on chronic respiratory failure hypoxia we will continue with capping trials titrate oxygen as tolerated. 2. COVID-19 virus infection recovery 3. Pneumonia due to COVID-19 treated 4. Tracheostomy remains in place 5. ARDS treated slowly improving   I have personally seen and evaluated the patient, evaluated laboratory and imaging results, formulated the assessment and plan and placed orders. The Patient requires high complexity decision making with multiple systems involvement.  Rounds were done with the Respiratory Therapy Director and Staff therapists and discussed with nursing staff also.  Yevonne Pax, MD [facility] Pulmonary Critical Care Medicine Sleep Medicine

## 2020-08-03 DIAGNOSIS — Z930 Tracheostomy status: Secondary | ICD-10-CM | POA: Diagnosis not present

## 2020-08-03 DIAGNOSIS — J1282 Pneumonia due to coronavirus disease 2019: Secondary | ICD-10-CM | POA: Diagnosis not present

## 2020-08-03 DIAGNOSIS — J9621 Acute and chronic respiratory failure with hypoxia: Secondary | ICD-10-CM | POA: Diagnosis not present

## 2020-08-03 DIAGNOSIS — U071 COVID-19: Secondary | ICD-10-CM | POA: Diagnosis not present

## 2020-08-03 DIAGNOSIS — J80 Acute respiratory distress syndrome: Secondary | ICD-10-CM | POA: Diagnosis not present

## 2020-08-03 NOTE — Progress Notes (Signed)
Pulmonary Critical Care Medicine [facility] [place]   PULMONARY CRITICAL CARE SERVICE  PROGRESS NOTE  Date of Service: 08/03/2020  Emma Anderson  [id number]  DOB: 06-Aug-1960   DOA: 06/15/2020  Referring Physician: Carron Curie, MD  HPI: Emma Anderson is a 60 y.o. female seen for follow up of Acute on Chronic Respiratory Failure.  Patient is comfortable right now without distress no fevers are noted  Medications: Reviewed on Rounds  Physical Exam:  Vitals: Temperature is 96.4 pulse 78 respiratory 30 blood pressure is 124/76 saturations 96%  Ventilator Settings on 2 L capping trials  . General: Comfortable at this time . Eyes: Grossly normal lids, irises & conjunctiva . ENT: grossly tongue is normal . Neck: no obvious mass . Cardiovascular: S1 S2 normal no gallop . Respiratory: No rhonchi very coarse breath sounds . Abdomen: soft . Skin: no rash seen on limited exam . Musculoskeletal: not rigid . Psychiatric:unable to assess . Neurologic: no seizure no involuntary movements         Lab Data:   Basic Metabolic Panel: Recent Labs  Lab 07/28/20 0416 08/01/20 0414  NA 135 135  K 3.9 3.8  CL 97* 97*  CO2 30 28  GLUCOSE 115* 91  BUN 24* 21  CREATININE 0.50 0.57  CALCIUM 9.4 9.6  MG 1.9 1.9  PHOS 4.6  --     ABG: No results for input(s): PHART, PCO2ART, PO2ART, HCO3, O2SAT in the last 168 hours.  Liver Function Tests: No results for input(s): AST, ALT, ALKPHOS, BILITOT, PROT, ALBUMIN in the last 168 hours. No results for input(s): LIPASE, AMYLASE in the last 168 hours. No results for input(s): AMMONIA in the last 168 hours.  CBC: Recent Labs  Lab 07/28/20 0416 08/01/20 0414  WBC 6.0 6.0  HGB 9.1* 8.6*  HCT 27.3* 26.8*  MCV 102.2* 103.5*  PLT 247 206    Cardiac Enzymes: No results for input(s): CKTOTAL, CKMB, CKMBINDEX, TROPONINI in the last 168 hours.  BNP (last 3 results) No results for input(s): BNP in the last 8760  hours.  ProBNP (last 3 results) No results for input(s): PROBNP in the last 8760 hours.  Radiological Exams: No results found.  Assessment/Plan Active Problems:   Acute on chronic respiratory failure with hypoxia (HCC)   COVID-19 virus infection   Tracheostomy status (HCC)   Pneumonia due to COVID-19 virus   Acute respiratory distress syndrome (ARDS) due to COVID-19 virus (HCC)   1. Acute on chronic respiratory failure with hypoxia we will continue with 2 L oxygen titrate as tolerated. 2. COVID-19 virus infection recovery 3. Tracheostomy remains in place 4. Pneumonia due to COVID-19 treated 5. ARDS no change continue to follow along   I have personally seen and evaluated the patient, evaluated laboratory and imaging results, formulated the assessment and plan and placed orders. The Patient requires high complexity decision making with multiple systems involvement.  Rounds were done with the Respiratory Therapy Director and Staff therapists and discussed with nursing staff also.  Yevonne Pax, MD [facility] Pulmonary Critical Care Medicine Sleep Medicine

## 2020-08-04 ENCOUNTER — Other Ambulatory Visit (HOSPITAL_COMMUNITY): Payer: Medicare (Managed Care)

## 2020-08-04 DIAGNOSIS — U071 COVID-19: Secondary | ICD-10-CM | POA: Diagnosis not present

## 2020-08-04 DIAGNOSIS — J1282 Pneumonia due to coronavirus disease 2019: Secondary | ICD-10-CM | POA: Diagnosis not present

## 2020-08-04 DIAGNOSIS — J80 Acute respiratory distress syndrome: Secondary | ICD-10-CM | POA: Diagnosis not present

## 2020-08-04 DIAGNOSIS — Z930 Tracheostomy status: Secondary | ICD-10-CM | POA: Diagnosis not present

## 2020-08-04 DIAGNOSIS — J9621 Acute and chronic respiratory failure with hypoxia: Secondary | ICD-10-CM | POA: Diagnosis not present

## 2020-08-04 LAB — BASIC METABOLIC PANEL
Anion gap: 9 (ref 5–15)
BUN: 20 mg/dL (ref 8–23)
CO2: 29 mmol/L (ref 22–32)
Calcium: 9.7 mg/dL (ref 8.9–10.3)
Chloride: 101 mmol/L (ref 98–111)
Creatinine, Ser: 0.61 mg/dL (ref 0.44–1.00)
GFR, Estimated: >60 mL/min (ref >60)
Glucose, Bld: 75 mg/dL (ref 70–99)
Potassium: 4 mmol/L (ref 3.5–5.1)
Sodium: 139 mmol/L (ref 135–145)

## 2020-08-04 LAB — CBC
HCT: 32.9 % — ABNORMAL LOW (ref 36.0–46.0)
Hemoglobin: 10.5 g/dL — ABNORMAL LOW (ref 12.0–15.0)
MCH: 33.1 pg (ref 26.0–34.0)
MCHC: 31.9 g/dL (ref 30.0–36.0)
MCV: 103.8 fL — ABNORMAL HIGH (ref 80.0–100.0)
Platelets: 261 10*3/uL (ref 150–400)
RBC: 3.17 MIL/uL — ABNORMAL LOW (ref 3.87–5.11)
RDW: 14.9 % (ref 11.5–15.5)
WBC: 5.8 10*3/uL (ref 4.0–10.5)
nRBC: 0 % (ref 0.0–0.2)

## 2020-08-04 LAB — MAGNESIUM: Magnesium: 2.1 mg/dL (ref 1.7–2.4)

## 2020-08-04 NOTE — Progress Notes (Signed)
Pulmonary Critical Care Medicine [facility] [place]   PULMONARY CRITICAL CARE SERVICE  PROGRESS NOTE  Date of Service: 08/04/2020  Emma Anderson  SNK:[id number]  DOB: [birth year]   DOA: 06/15/2020  Referring Physician: Carron Curie, MD  HPI: Emma Anderson is a 60 y.o. female seen for follow up of Acute on Chronic Respiratory Failure.  Patient still with copious secretions has been doing fine with capping however has had increased secretions noted  Medications: Reviewed on Rounds  Physical Exam:  Vitals: Temperature is 96.8 pulse 83 respiratory 20 blood pressure is 148/90 saturations 96%  Ventilator Settings capping off the ventilator on 2 L  . General: Comfortable at this time . Eyes: Grossly normal lids, irises & conjunctiva . ENT: grossly tongue is normal . Neck: no obvious mass . Cardiovascular: S1 S2 normal no gallop . Respiratory: No rhonchi very coarse breath sound . Abdomen: soft . Skin: no rash seen on limited exam . Musculoskeletal: not rigid . Psychiatric:unable to assess . Neurologic: no seizure no involuntary movements         Lab Data:   Basic Metabolic Panel: Recent Labs  Lab 08/01/20 0414 08/04/20 0356  NA 135 139  K 3.8 4.0  CL 97* 101  CO2 28 29  GLUCOSE 91 75  BUN 21 20  CREATININE 0.57 0.61  CALCIUM 9.6 9.7  MG 1.9 2.1    ABG: No results for input(s): PHART, PCO2ART, PO2ART, HCO3, O2SAT in the last 168 hours.  Liver Function Tests: No results for input(s): AST, ALT, ALKPHOS, BILITOT, PROT, ALBUMIN in the last 168 hours. No results for input(s): LIPASE, AMYLASE in the last 168 hours. No results for input(s): AMMONIA in the last 168 hours.  CBC: Recent Labs  Lab 08/01/20 0414 08/04/20 0356  WBC 6.0 5.8  HGB 8.6* 10.5*  HCT 26.8* 32.9*  MCV 103.5* 103.8*  PLT 206 261    Cardiac Enzymes: No results for input(s): CKTOTAL, CKMB, CKMBINDEX, TROPONINI in the last 168 hours.  BNP (last 3 results) No results for  input(s): BNP in the last 8760 hours.  ProBNP (last 3 results) No results for input(s): PROBNP in the last 8760 hours.  Radiological Exams: DG CHEST PORT 1 VIEW  Result Date: 08/04/2020 CLINICAL DATA:  Pneumonia EXAM: PORTABLE CHEST 1 VIEW COMPARISON:  July 24, 2020 FINDINGS: Tracheostomy catheter tip is 4.6 cm above the carina. No pneumothorax. There is persistent airspace opacity in portions of each mid lung and lung base regions. No consolidation. No new opacity is evident. Heart upper normal in size with pulmonary vascularity normal. No adenopathy. No bone lesions. IMPRESSION: Tracheostomy as described without pneumothorax. Patchy airspace opacity, somewhat more on the left than on the right, is stable, likely representing multifocal pneumonia. No consolidation. Question atypical organism pneumonia given this appearance. Check of COVID-19 status in this regard advised. Stable cardiac silhouette. Electronically Signed   By: Bretta Bang III M.D.   On: 08/04/2020 10:11    Assessment/Plan Active Problems:   Acute on chronic respiratory failure with hypoxia (HCC)   COVID-19 virus infection   Tracheostomy status (HCC)   Pneumonia due to COVID-19 virus   Acute respiratory distress syndrome (ARDS) due to COVID-19 virus (HCC)   1. Acute on chronic respiratory failure hypoxia plan is to continue with capping trials titrate oxygen continue pulmonary toilet 2. COVID-19 virus infection recovery 3. Pneumonia due to COVID-19 treated slow improvement secretions have increased order to follow-up chest x-ray and sputum production increased so therefore we  will get cultures.  Chest x-ray revealed patchy airspace disease little bit more on the left side again 4. ARDS improving 5. Tracheostomy remains in place right now with more copious secretions   I have personally seen and evaluated the patient, evaluated laboratory and imaging results, formulated the assessment and plan and placed orders. The  Patient requires high complexity decision making with multiple systems involvement.  Rounds were done with the Respiratory Therapy Director and Staff therapists and discussed with nursing staff also.  Yevonne Pax, MD [facility] Pulmonary Critical Care Medicine Sleep Medicine

## 2020-08-06 DIAGNOSIS — J9621 Acute and chronic respiratory failure with hypoxia: Secondary | ICD-10-CM | POA: Diagnosis not present

## 2020-08-06 DIAGNOSIS — J1282 Pneumonia due to coronavirus disease 2019: Secondary | ICD-10-CM | POA: Diagnosis not present

## 2020-08-06 DIAGNOSIS — J80 Acute respiratory distress syndrome: Secondary | ICD-10-CM | POA: Diagnosis not present

## 2020-08-06 DIAGNOSIS — U071 COVID-19: Secondary | ICD-10-CM | POA: Diagnosis not present

## 2020-08-06 DIAGNOSIS — Z930 Tracheostomy status: Secondary | ICD-10-CM | POA: Diagnosis not present

## 2020-08-06 LAB — BASIC METABOLIC PANEL
Anion gap: 9 (ref 5–15)
BUN: 27 mg/dL — ABNORMAL HIGH (ref 8–23)
CO2: 33 mmol/L — ABNORMAL HIGH (ref 22–32)
Calcium: 9.9 mg/dL (ref 8.9–10.3)
Chloride: 95 mmol/L — ABNORMAL LOW (ref 98–111)
Creatinine, Ser: 0.75 mg/dL (ref 0.44–1.00)
GFR, Estimated: >60 mL/min (ref >60)
Glucose, Bld: 159 mg/dL — ABNORMAL HIGH (ref 70–99)
Potassium: 3.7 mmol/L (ref 3.5–5.1)
Sodium: 137 mmol/L (ref 135–145)

## 2020-08-06 NOTE — Progress Notes (Signed)
Pulmonary Critical Care Medicine [facility] [place]   PULMONARY CRITICAL CARE SERVICE  PROGRESS NOTE  Date of Service: 08/06/2020  Emma Anderson  EXH:[id number]  DOB: Mar 13, 1961   DOA: 06/15/2020  Referring Physician: Carron Curie, MD  HPI: Emma Anderson is a 60 y.o. female seen for follow up of Acute on Chronic Respiratory Failure.  Capping right now on 2 L of oxygen good saturations are noted  Medications: Reviewed on Rounds  Physical Exam:  Vitals: Temperature is 97.5 pulse 57 respiratory 20 blood pressure 126/65 saturations 97%  Ventilator Settings capping on 2 L  . General: Comfortable at this time . Eyes: Grossly normal lids, irises & conjunctiva . ENT: grossly tongue is normal . Neck: no obvious mass . Cardiovascular: S1 S2 normal no gallop . Respiratory: No rhonchi very coarse breath sounds . Abdomen: soft . Skin: no rash seen on limited exam . Musculoskeletal: not rigid . Psychiatric:unable to assess . Neurologic: no seizure no involuntary movements         Lab Data:   Basic Metabolic Panel: Recent Labs  Lab 08/01/20 0414 08/04/20 0356  NA 135 139  K 3.8 4.0  CL 97* 101  CO2 28 29  GLUCOSE 91 75  BUN 21 20  CREATININE 0.57 0.61  CALCIUM 9.6 9.7  MG 1.9 2.1    ABG: No results for input(s): PHART, PCO2ART, PO2ART, HCO3, O2SAT in the last 168 hours.  Liver Function Tests: No results for input(s): AST, ALT, ALKPHOS, BILITOT, PROT, ALBUMIN in the last 168 hours. No results for input(s): LIPASE, AMYLASE in the last 168 hours. No results for input(s): AMMONIA in the last 168 hours.  CBC: Recent Labs  Lab 08/01/20 0414 08/04/20 0356  WBC 6.0 5.8  HGB 8.6* 10.5*  HCT 26.8* 32.9*  MCV 103.5* 103.8*  PLT 206 261    Cardiac Enzymes: No results for input(s): CKTOTAL, CKMB, CKMBINDEX, TROPONINI in the last 168 hours.  BNP (last 3 results) No results for input(s): BNP in the last 8760 hours.  ProBNP (last 3 results) No results  for input(s): PROBNP in the last 8760 hours.  Radiological Exams: No results found.  Assessment/Plan Active Problems:   Acute on chronic respiratory failure with hypoxia (HCC)   COVID-19 virus infection   Tracheostomy status (HCC)   Pneumonia due to COVID-19 virus   Acute respiratory distress syndrome (ARDS) due to COVID-19 virus (HCC)   1. Acute on chronic respiratory failure hypoxia we will continue to wean ready for decannulation. 2. COVID-19 virus infection resolved 3. Tracheostomy will be removed 4. Pneumonia due to COVID-19 improving 5. ARDS at baseline   I have personally seen and evaluated the patient, evaluated laboratory and imaging results, formulated the assessment and plan and placed orders. The Patient requires high complexity decision making with multiple systems involvement.  Rounds were done with the Respiratory Therapy Director and Staff therapists and discussed with nursing staff also.  Yevonne Pax, MD [facility] Pulmonary Critical Care Medicine Sleep Medicine

## 2020-08-07 LAB — BASIC METABOLIC PANEL
Anion gap: 7 (ref 5–15)
BUN: 22 mg/dL (ref 8–23)
CO2: 34 mmol/L — ABNORMAL HIGH (ref 22–32)
Calcium: 10.3 mg/dL (ref 8.9–10.3)
Chloride: 98 mmol/L (ref 98–111)
Creatinine, Ser: 0.84 mg/dL (ref 0.44–1.00)
GFR, Estimated: >60 mL/min (ref >60)
Glucose, Bld: 118 mg/dL — ABNORMAL HIGH (ref 70–99)
Potassium: 4.6 mmol/L (ref 3.5–5.1)
Sodium: 139 mmol/L (ref 135–145)

## 2020-08-07 LAB — CBC
HCT: 32.2 % — ABNORMAL LOW (ref 36.0–46.0)
Hemoglobin: 10.5 g/dL — ABNORMAL LOW (ref 12.0–15.0)
MCH: 33.3 pg (ref 26.0–34.0)
MCHC: 32.6 g/dL (ref 30.0–36.0)
MCV: 102.2 fL — ABNORMAL HIGH (ref 80.0–100.0)
Platelets: 308 10*3/uL (ref 150–400)
RBC: 3.15 MIL/uL — ABNORMAL LOW (ref 3.87–5.11)
RDW: 14.6 % (ref 11.5–15.5)
WBC: 5.4 10*3/uL (ref 4.0–10.5)
nRBC: 0 % (ref 0.0–0.2)

## 2020-08-07 LAB — MAGNESIUM: Magnesium: 2.1 mg/dL (ref 1.7–2.4)

## 2020-08-07 LAB — PHOSPHORUS: Phosphorus: 4.6 mg/dL (ref 2.5–4.6)

## 2020-08-08 LAB — CULTURE, RESPIRATORY W GRAM STAIN

## 2020-08-09 LAB — EXPECTORATED SPUTUM ASSESSMENT W GRAM STAIN, RFLX TO RESP C

## 2020-08-11 LAB — BASIC METABOLIC PANEL
Anion gap: 8 (ref 5–15)
BUN: 24 mg/dL — ABNORMAL HIGH (ref 8–23)
CO2: 25 mmol/L (ref 22–32)
Calcium: 8.9 mg/dL (ref 8.9–10.3)
Chloride: 103 mmol/L (ref 98–111)
Creatinine, Ser: 0.69 mg/dL (ref 0.44–1.00)
GFR, Estimated: >60 mL/min (ref >60)
Glucose, Bld: 132 mg/dL — ABNORMAL HIGH (ref 70–99)
Potassium: 3.4 mmol/L — ABNORMAL LOW (ref 3.5–5.1)
Sodium: 136 mmol/L (ref 135–145)

## 2020-08-11 LAB — MAGNESIUM: Magnesium: 2 mg/dL (ref 1.7–2.4)

## 2020-08-11 LAB — CBC
HCT: 31.5 % — ABNORMAL LOW (ref 36.0–46.0)
Hemoglobin: 10 g/dL — ABNORMAL LOW (ref 12.0–15.0)
MCH: 32.5 pg (ref 26.0–34.0)
MCHC: 31.7 g/dL (ref 30.0–36.0)
MCV: 102.3 fL — ABNORMAL HIGH (ref 80.0–100.0)
Platelets: 255 10*3/uL (ref 150–400)
RBC: 3.08 MIL/uL — ABNORMAL LOW (ref 3.87–5.11)
RDW: 14.8 % (ref 11.5–15.5)
WBC: 8 10*3/uL (ref 4.0–10.5)
nRBC: 0 % (ref 0.0–0.2)

## 2020-08-11 LAB — PHOSPHORUS: Phosphorus: 3.5 mg/dL (ref 2.5–4.6)

## 2020-08-12 LAB — POTASSIUM: Potassium: 4.2 mmol/L (ref 3.5–5.1)

## 2020-08-13 LAB — CULTURE, RESPIRATORY W GRAM STAIN

## 2021-02-28 IMAGING — DX DG CHEST 1V PORT
1 series · 1 of 1 positions shown · non-contrast
Comparison: Radiograph 07/13/2020, CT 07/09/2020

CLINICAL DATA: Pneumonia, edema

EXAM:
PORTABLE CHEST 1 VIEW

[chest ap]
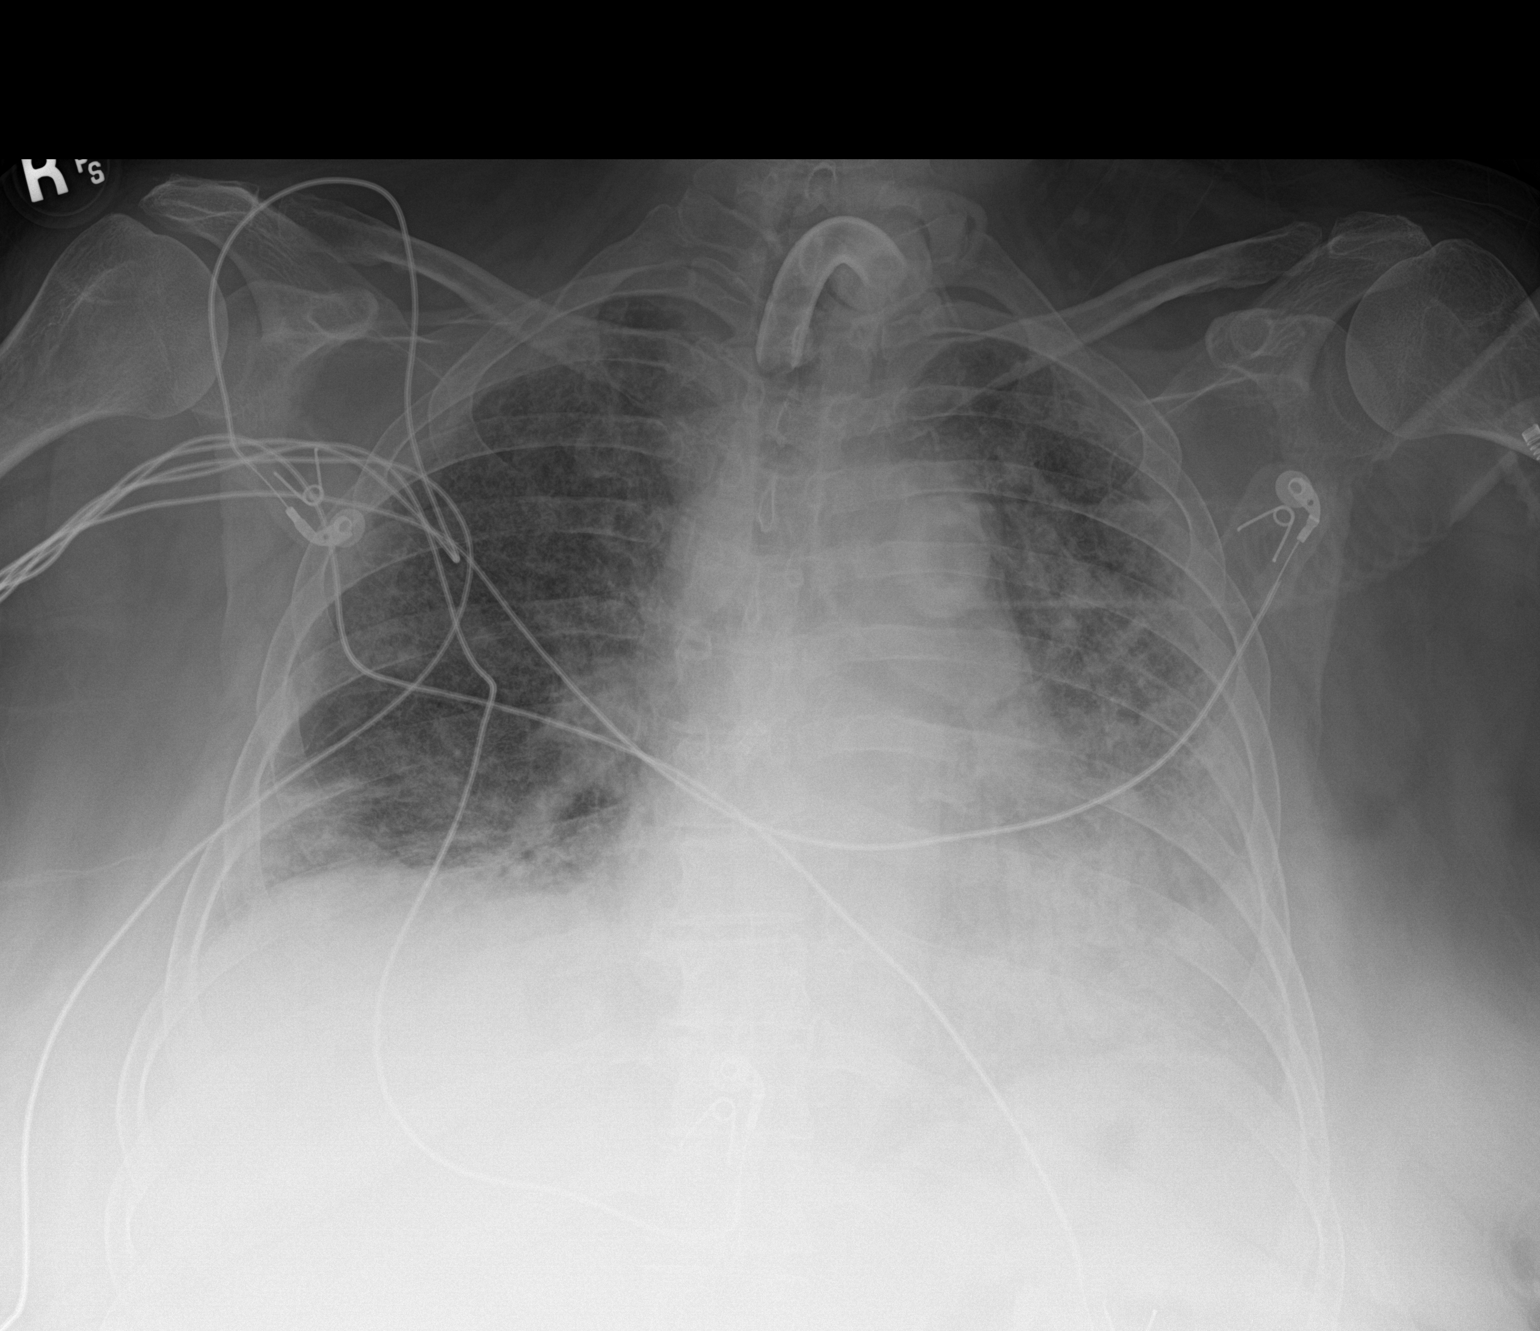

[1 of 1 positions shown; findings below may reference images not displayed]

FINDINGS: Tracheostomy tube tip terminates in the mid to upper trachea, 6.5 cm
from the carina.

Telemetry leads and support devices overlie the chest.

Diffuse heterogeneous opacities again seen throughout both lungs
with some slight interval decrease likely related to improving lung
volumes. Stable cardiomediastinal contours with mild cardiomegaly.
No acute osseous or soft tissue abnormality.
IMPRESSION: Slightly diminished heterogeneous opacities in both lungs, possibly
related to improving lung volumes.

## 2021-03-08 IMAGING — DX DG CHEST 1V PORT
1 series · 1 of 1 positions shown · non-contrast
Comparison: July 16, 2020.

CLINICAL DATA: Pneumonia.

EXAM:
PORTABLE CHEST 1 VIEW

[chest ap]
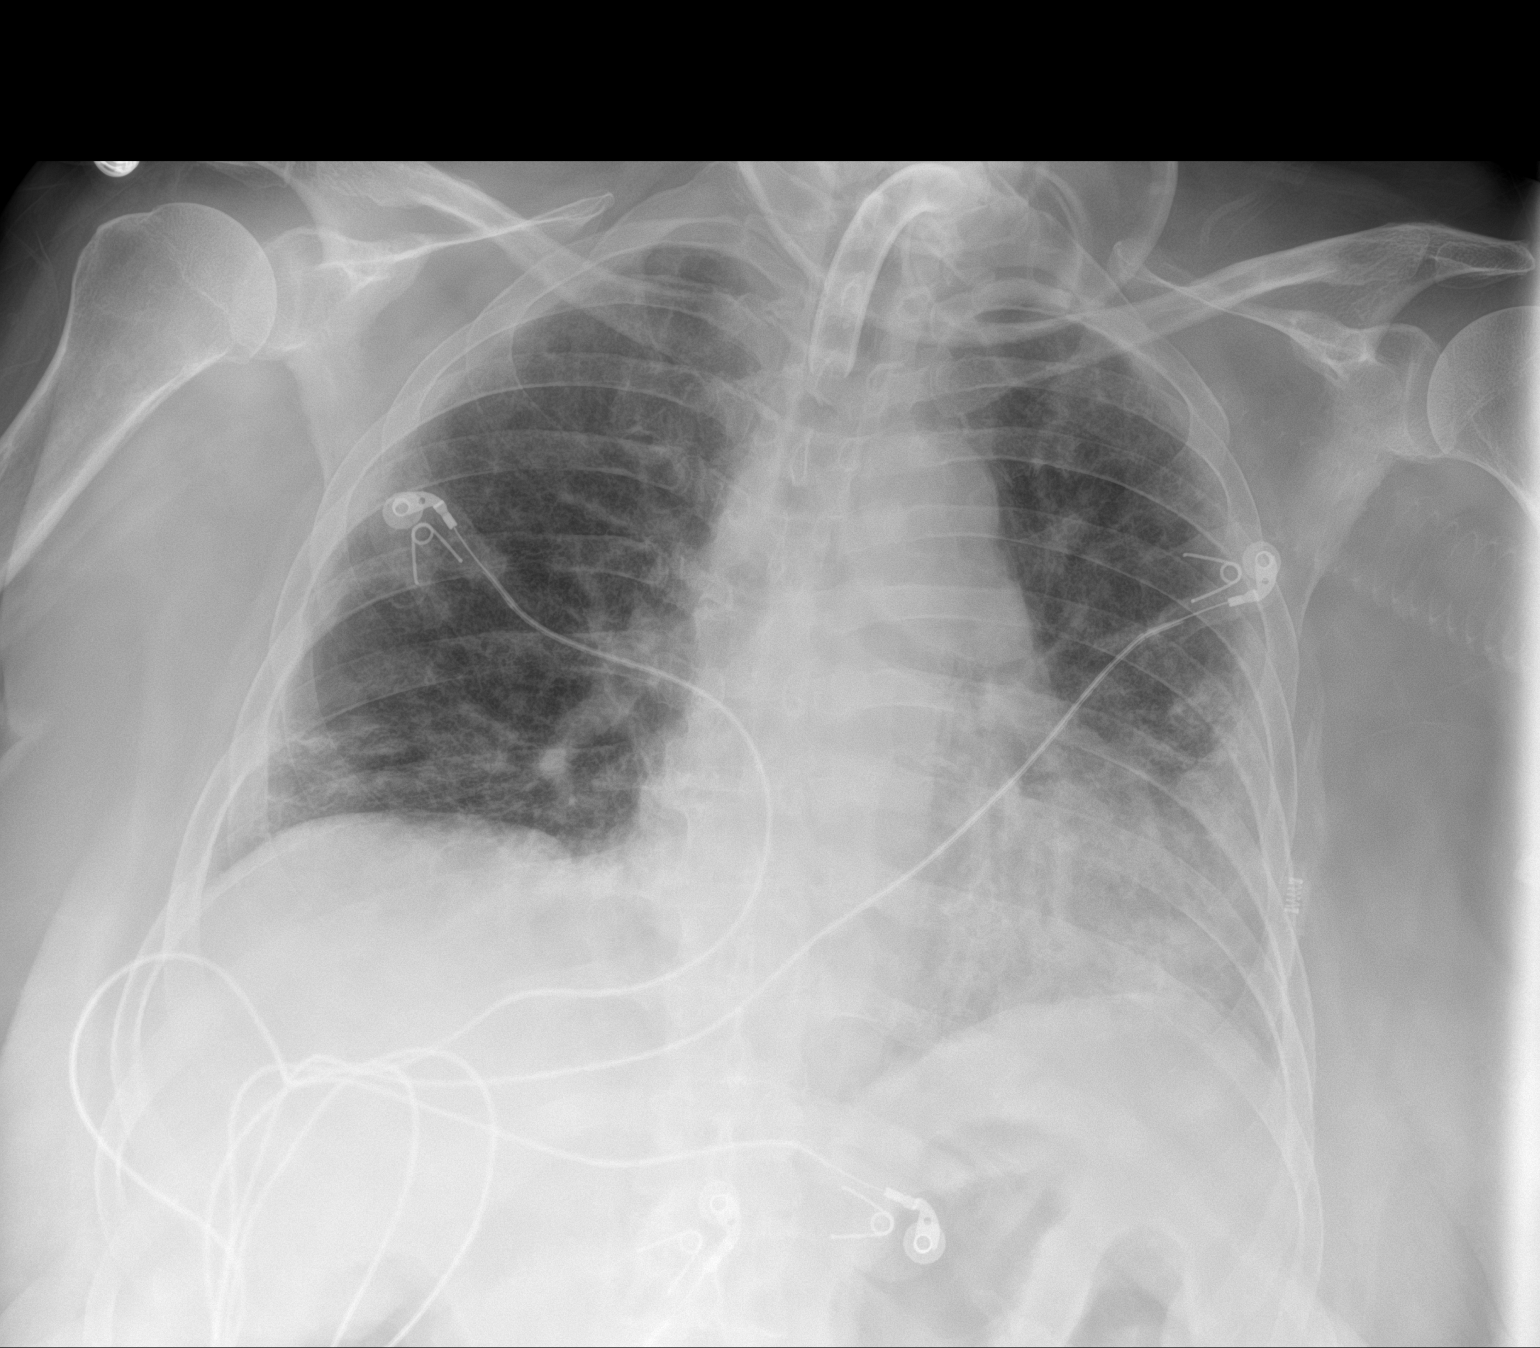

[1 of 1 positions shown; findings below may reference images not displayed]

FINDINGS: Stable cardiomegaly. Tracheostomy tube is unchanged in position. No
pneumothorax is noted. No significant pleural effusion is noted.
Stable bibasilar opacities are noted concerning for atelectasis or
infiltrates. Bony thorax is unremarkable.
IMPRESSION: Stable bibasilar opacities are noted concerning for atelectasis or
infiltrates.
